# Patient Record
Sex: Male | Born: 1985 | Race: Black or African American | Hispanic: No | Marital: Single | Smoking: Never smoker
Health system: Southern US, Community
[De-identification: ages and names within clinical notes are randomized; demographics above are authoritative.]

## PROBLEM LIST (undated history)

## (undated) ENCOUNTER — Emergency Department: Admission: EM

## (undated) ENCOUNTER — Emergency Department: Admission: EM | Payer: Self-pay

## (undated) DIAGNOSIS — J45909 Unspecified asthma, uncomplicated: Secondary | ICD-10-CM

---

## 2006-04-06 ENCOUNTER — Emergency Department: Payer: Self-pay | Admitting: Unknown Physician Specialty

## 2007-01-19 ENCOUNTER — Emergency Department: Payer: Self-pay | Admitting: Emergency Medicine

## 2007-01-21 ENCOUNTER — Ambulatory Visit: Payer: Self-pay | Admitting: Emergency Medicine

## 2007-06-24 ENCOUNTER — Emergency Department: Payer: Self-pay | Admitting: Emergency Medicine

## 2007-06-24 ENCOUNTER — Other Ambulatory Visit: Payer: Self-pay

## 2007-06-30 ENCOUNTER — Emergency Department: Payer: Self-pay | Admitting: Emergency Medicine

## 2007-06-30 ENCOUNTER — Other Ambulatory Visit: Payer: Self-pay

## 2007-10-25 ENCOUNTER — Emergency Department: Payer: Self-pay | Admitting: Emergency Medicine

## 2008-06-01 ENCOUNTER — Emergency Department: Payer: Self-pay | Admitting: Internal Medicine

## 2010-07-18 ENCOUNTER — Emergency Department: Payer: Self-pay | Admitting: Internal Medicine

## 2012-04-28 ENCOUNTER — Emergency Department: Payer: Self-pay | Admitting: Unknown Physician Specialty

## 2012-08-28 ENCOUNTER — Emergency Department: Payer: Self-pay | Admitting: Internal Medicine

## 2013-07-07 ENCOUNTER — Emergency Department: Payer: Self-pay | Admitting: Emergency Medicine

## 2014-01-05 ENCOUNTER — Emergency Department: Payer: Self-pay | Admitting: Emergency Medicine

## 2014-05-15 ENCOUNTER — Emergency Department: Payer: Self-pay | Admitting: Emergency Medicine

## 2015-03-29 ENCOUNTER — Encounter: Payer: Self-pay | Admitting: Emergency Medicine

## 2015-03-29 ENCOUNTER — Emergency Department
Admission: EM | Admit: 2015-03-29 | Discharge: 2015-03-29 | Disposition: A | Discharge status: Home / Self Care | Payer: No Typology Code available for payment source | Attending: Emergency Medicine | Admitting: Emergency Medicine

## 2015-03-29 DIAGNOSIS — S39012A Strain of muscle, fascia and tendon of lower back, initial encounter: Secondary | ICD-10-CM | POA: Insufficient documentation

## 2015-03-29 DIAGNOSIS — Y998 Other external cause status: Secondary | ICD-10-CM | POA: Insufficient documentation

## 2015-03-29 DIAGNOSIS — S8991XA Unspecified injury of right lower leg, initial encounter: Secondary | ICD-10-CM | POA: Diagnosis not present

## 2015-03-29 DIAGNOSIS — S0990XA Unspecified injury of head, initial encounter: Secondary | ICD-10-CM | POA: Insufficient documentation

## 2015-03-29 DIAGNOSIS — Y92410 Unspecified street and highway as the place of occurrence of the external cause: Secondary | ICD-10-CM | POA: Diagnosis not present

## 2015-03-29 DIAGNOSIS — S3992XA Unspecified injury of lower back, initial encounter: Secondary | ICD-10-CM | POA: Diagnosis present

## 2015-03-29 DIAGNOSIS — M25561 Pain in right knee: Secondary | ICD-10-CM

## 2015-03-29 DIAGNOSIS — Y9389 Activity, other specified: Secondary | ICD-10-CM | POA: Insufficient documentation

## 2015-03-29 HISTORY — DX: Unspecified asthma, uncomplicated: J45.909

## 2015-03-29 MED ORDER — CYCLOBENZAPRINE HCL 10 MG PO TABS: 10.0000 mg | ORAL_TABLET | Freq: Three times a day (TID) | ORAL | Status: DC | PRN

## 2015-03-29 MED ORDER — IBUPROFEN 800 MG PO TABS: 800.0000 mg | ORAL_TABLET | Freq: Three times a day (TID) | ORAL | Status: DC | PRN

## 2015-03-29 MED ORDER — TRAMADOL HCL 50 MG PO TABS: 50.0000 mg | ORAL_TABLET | Freq: Four times a day (QID) | ORAL | Status: DC | PRN
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
## 2015-07-04 ENCOUNTER — Emergency Department: Payer: Self-pay

## 2015-07-04 ENCOUNTER — Emergency Department
Admission: EM | Admit: 2015-07-04 | Discharge: 2015-07-04 | Disposition: A | Discharge status: Home / Self Care | Payer: Self-pay | Attending: Emergency Medicine | Admitting: Emergency Medicine

## 2015-07-04 DIAGNOSIS — M25571 Pain in right ankle and joints of right foot: Secondary | ICD-10-CM | POA: Insufficient documentation

## 2015-07-04 DIAGNOSIS — J45909 Unspecified asthma, uncomplicated: Secondary | ICD-10-CM | POA: Insufficient documentation

## 2015-07-04 DIAGNOSIS — Z79899 Other long term (current) drug therapy: Secondary | ICD-10-CM | POA: Insufficient documentation

## 2015-07-04 MED ORDER — IBUPROFEN 600 MG PO TABS: 600.0000 mg | ORAL_TABLET | Freq: Four times a day (QID) | ORAL | Status: DC | PRN
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
## 2015-07-04 NOTE — ED Notes (Signed)
Pt states while standing on the escalator yesterday that stopped suddenly causing him to fall, pt states that he injured his rt foot and is having pain that radiates up the rt shin, no difficulty noted with walking or change in gait at this time

## 2015-07-04 NOTE — Discharge Instructions (Signed)

## 2016-01-31 ENCOUNTER — Emergency Department
Admission: EM | Admit: 2016-01-31 | Discharge: 2016-01-31 | Disposition: A | Discharge status: Home / Self Care | Payer: Self-pay | Attending: Emergency Medicine | Admitting: Emergency Medicine

## 2016-01-31 ENCOUNTER — Emergency Department: Payer: Self-pay

## 2016-01-31 ENCOUNTER — Encounter: Payer: Self-pay | Admitting: Emergency Medicine

## 2016-01-31 DIAGNOSIS — Z79899 Other long term (current) drug therapy: Secondary | ICD-10-CM | POA: Insufficient documentation

## 2016-01-31 DIAGNOSIS — I300 Acute nonspecific idiopathic pericarditis: Secondary | ICD-10-CM | POA: Insufficient documentation

## 2016-01-31 DIAGNOSIS — J45909 Unspecified asthma, uncomplicated: Secondary | ICD-10-CM | POA: Insufficient documentation

## 2016-01-31 LAB — CBC
HCT: 47.7 % (ref 40.0–52.0)
Hemoglobin: 15.8 g/dL (ref 13.0–18.0)
MCH: 29 pg (ref 26.0–34.0)
MCHC: 33.1 g/dL (ref 32.0–36.0)
MCV: 87.7 fL (ref 80.0–100.0)
PLATELETS: 226 10*3/uL (ref 150–440)
RBC: 5.44 MIL/uL (ref 4.40–5.90)
RDW: 12.9 % (ref 11.5–14.5)
WBC: 7.2 10*3/uL (ref 3.8–10.6)

## 2016-01-31 LAB — BASIC METABOLIC PANEL
Anion gap: 4 — ABNORMAL LOW (ref 5–15)
BUN: 10 mg/dL (ref 6–20)
CALCIUM: 9.9 mg/dL (ref 8.9–10.3)
CHLORIDE: 102 mmol/L (ref 101–111)
CO2: 31 mmol/L (ref 22–32)
CREATININE: 0.91 mg/dL (ref 0.61–1.24)
GFR calc non Af Amer: >60 mL/min (ref >60)
GLUCOSE: 104 mg/dL — AB (ref 65–99)
Potassium: 4.3 mmol/L (ref 3.5–5.1)
Sodium: 137 mmol/L (ref 135–145)

## 2016-01-31 LAB — TROPONIN I

## 2016-01-31 MED ORDER — COLCHICINE 0.6 MG PO TABS: 0.6000 mg | ORAL_TABLET | Freq: Two times a day (BID) | ORAL | 1 refills | Status: DC

## 2016-01-31 MED ORDER — IBUPROFEN 800 MG PO TABS: 800.0000 mg | ORAL_TABLET | Freq: Three times a day (TID) | ORAL | 0 refills | Status: DC

## 2016-01-31 NOTE — ED Triage Notes (Signed)
C/O chest pain x 1 day and headache since Friday.  States symptoms worsened today at work.

## 2016-01-31 NOTE — ED Provider Notes (Signed)
[facility] Emergency Department Provider Note   ____________________________________________    I have reviewed the triage vital signs and the nursing notes.   HISTORY  Chief Complaint Chest Pain     HPI Dennis Hayes is a 30 y.o. male who presents with chest pain which she reports is substernal and constant over the last 24 hours. He has never had chest pain before. He denies shortness of breath. He denies medical history besides asthma. No fevers or chills. He denies recent upper respiratory infection. No dizziness.   Past Medical History:  Diagnosis Date  . Asthma     There are no active problems to display for this patient.   History reviewed. No pertinent surgical history.  Prior to Admission medications   Medication Sig Start Date End Date Taking? Authorizing Provider  colchicine 0.6 MG tablet Take 1 tablet (0.6 mg total) by mouth 2 (two) times daily. 01/31/16 01/30/17  Jene Everyobert Vittoria Noreen, MD  cyclobenzaprine (FLEXERIL) 10 MG tablet Take 1 tablet (10 mg total) by mouth every 8 (eight) hours as needed for muscle spasms. 03/29/15   Joni Reiningonald K Smith, PA-C  ibuprofen (ADVIL,MOTRIN) 800 MG tablet Take 1 tablet (800 mg total) by mouth every 8 (eight) hours. 01/31/16   Jene Everyobert Ercell Razon, MD  traMADol (ULTRAM) 50 MG tablet Take 1 tablet (50 mg total) by mouth every 6 (six) hours as needed for moderate pain. 03/29/15   Joni Reiningonald K Smith, PA-C     Allergies Review of patient's allergies indicates no known allergies.  No family history on file.  Social History Social History  Substance Use Topics  . Smoking status: Never Smoker  . Smokeless tobacco: Never Used  . Alcohol use No    Review of Systems  Constitutional: No fever/chills Eyes: No visual changes.  ENT: No sore throat. Cardiovascular: As above Respiratory: Denies shortness of breath. Gastrointestinal: No abdominal pain.  No nausea, no vomiting.   Genitourinary: Negative for  dysuria. Musculoskeletal: Negative for back pain. Skin: Negative for rash. Neurological: Negative for headaches or weakness  10-point ROS otherwise negative.  ____________________________________________   PHYSICAL EXAM:  VITAL SIGNS: ED Triage Vitals  Enc Vitals Group     BP 01/31/16 1056 118/70     Pulse Rate 01/31/16 1056 63     Resp 01/31/16 1056 18     Temp 01/31/16 1056 98.1 F (36.7 C)     Temp Source 01/31/16 1056 Oral     SpO2 01/31/16 1056 98 %     Weight 01/31/16 1055 186 lb (84.4 kg)     Height 01/31/16 1055 5\' 9"  (1.753 m)     Head Circumference --      Peak Flow --      Pain Score 01/31/16 1055 7     Pain Loc --      Pain Edu? --      Excl. in GC? --     Constitutional: Alert and oriented. No acute distress.  Eyes: Conjunctivae are normal.  Head: Atraumatic. Nose: No congestion/rhinnorhea. Mouth/Throat: Mucous membranes are moist.    Cardiovascular: Normal rate, regular rhythm. No rub or murmur.  Good peripheral circulation. Respiratory: Normal respiratory effort.  No retractions.  Gastrointestinal: Soft and nontender. No distention.  No CVA tenderness. Genitourinary: deferred Musculoskeletal: No lower extremity tenderness nor edema.  Warm and well perfused Neurologic:  Normal speech and language. No gross focal neurologic deficits are appreciated.  Skin:  Skin is warm, dry and intact. No rash noted.  Psychiatric: Mood and affect are normal. Speech and behavior are normal.  ____________________________________________   LABS (all labs ordered are listed, but only abnormal results are displayed)  Labs Reviewed  BASIC METABOLIC PANEL - Abnormal; Notable for the following:       Result Value   Glucose, Bld 104 (*)    Anion gap 4 (*)    All other components within normal limits  CBC  TROPONIN I   ____________________________________________  EKG  ED ECG REPORT I, Jene EveryKINNER, Lorine Iannaccone, the attending physician, personally viewed and interpreted this  ECG.   Date: 01/31/2016    Rate: 61  Rhythm: normal EKG, normal sinus rhythm  Axis: Normal  Intervals:none  ST&T Change: Diffuse mild ST elevation  ____________________________________________  RADIOLOGY  Chest x-ray unremarkable ____________________________________________   PROCEDURES  Procedure(s) performed: No    Critical Care performed:No ____________________________________________   INITIAL IMPRESSION / ASSESSMENT AND PLAN / ED COURSE  Pertinent labs & imaging results that were available during my care of the patient were reviewed by me and considered in my medical decision making (see chart for details).  Patient presents with mild constant chest discomfort. He reports it is improved today he continues to have it. He denies short of breath. No medical history, no recent URIs. EKG concerning for pericarditis. Troponin and labs are unremarkable otherwise. Chest x-ray normal.  Clinical Course  Given the patient is currently well-appearing, labs unremarkable, vitals normal we will treat with NSAIDs and colchicine, cardiology follow-up. Return to the ED if any worsening. ____________________________________________   FINAL CLINICAL IMPRESSION(S) / ED DIAGNOSES  Final diagnoses:  Acute idiopathic pericarditis      NEW MEDICATIONS STARTED DURING THIS VISIT:  Discharge Medication List as of 01/31/2016  2:10 PM    START taking these medications   Details  colchicine 0.6 MG tablet Take 1 tablet (0.6 mg total) by mouth 2 (two) times daily., Starting Mon 01/31/2016, Until Tue 01/30/2017, Print         Note:  This document was prepared using Dragon voice recognition software and may include unintentional dictation errors.    Jene Everyobert Keeyon Privitera, MD 01/31/16 [phone]

## 2016-02-03 ENCOUNTER — Ambulatory Visit (INDEPENDENT_AMBULATORY_CARE_PROVIDER_SITE_OTHER): Payer: Self-pay | Admitting: Cardiology

## 2016-02-03 ENCOUNTER — Encounter: Payer: Self-pay | Admitting: Cardiology

## 2016-02-03 VITALS — BP 90/60 | HR 67 | Ht 70.0 in | Wt 194.8 lb

## 2016-02-03 DIAGNOSIS — R011 Cardiac murmur, unspecified: Secondary | ICD-10-CM

## 2016-02-03 DIAGNOSIS — R9431 Abnormal electrocardiogram [ECG] [EKG]: Secondary | ICD-10-CM

## 2016-02-03 DIAGNOSIS — I308 Other forms of acute pericarditis: Secondary | ICD-10-CM

## 2016-02-03 MED ORDER — OMEPRAZOLE 20 MG PO CPDR: 20.0000 mg | DELAYED_RELEASE_CAPSULE | Freq: Every day | ORAL | 0 refills | Status: DC

## 2016-02-03 NOTE — Progress Notes (Signed)
Cardiology Office Note   Date:  02/03/2016   ID:  Dennis BlalockJohn D Wernette, DOB 01/29/1986, MRN [id number]  Referring Doctor:  No PCP Per Patient   Cardiologist:   Almond LintAileen Teria Khachatryan, MD   Reason for consultation:  Chief Complaint  Patient presents with  . other    F/u ED due to chest pain c/o chest pain and left arm pain today. Meds reviewed verbally with pt.      History of Present Illness: Dennis Hayes is a 30 y.o. male who presents for Chest pain.  Patient presented to the ER on Monday, 01/31/2016 for chest pain. Apparently, it has been going on for quite some time now but he has not really paid much attention to it. On Monday, it was more persistent, sharp pain across the chest, not related to exertion or positional change, nonradiating, mild to moderate in intensity, lasting on and off throughout the day. He was found to have pericarditis on EKG. He was started on ibuprofen on colchicine and asked to follow-up with cardiology.  He has had not noticed a significant change in his symptoms. He continues to take colchicine and ibuprofen. No bleeding. No shortness of breath, loss of consciousness, palpitations.   ROS:  Please see the history of present illness. Aside from mentioned under HPI, all other systems are reviewed and negative.     Past Medical History:  Diagnosis Date  . Asthma     History reviewed. No pertinent surgical history.   reports that he has never smoked. He has never used smokeless tobacco. He reports that he does not drink alcohol or use drugs.   family history includes Heart attack in his maternal grandmother; Hypertension in his maternal grandfather.   Outpatient Medications Prior to Visit  Medication Sig Dispense Refill  . colchicine 0.6 MG tablet Take 1 tablet (0.6 mg total) by mouth 2 (two) times daily. 120 tablet 1  . cyclobenzaprine (FLEXERIL) 10 MG tablet Take 1 tablet (10 mg total) by mouth every 8 (eight) hours as needed for muscle spasms. 15 tablet 0    . ibuprofen (ADVIL,MOTRIN) 800 MG tablet Take 1 tablet (800 mg total) by mouth every 8 (eight) hours. 45 tablet 0  . traMADol (ULTRAM) 50 MG tablet Take 1 tablet (50 mg total) by mouth every 6 (six) hours as needed for moderate pain. (Patient not taking: Reported on 02/03/2016) 12 tablet 0   No facility-administered medications prior to visit.      Allergies: Review of patient's allergies indicates no known allergies.    PHYSICAL EXAM: VS:  BP 90/60 (BP Location: Left Arm, Patient Position: Sitting, Cuff Size: Normal)   Pulse 67   Ht 5\' 10"  (1.778 m)   Wt 194 lb 12 oz (88.3 kg)   BMI 27.94 kg/m  , Body mass index is 27.94 kg/m. Wt Readings from Last 3 Encounters:  02/03/16 194 lb 12 oz (88.3 kg)  01/31/16 186 lb (84.4 kg)  07/04/15 203 lb (92.1 kg)    GENERAL:  well developed, well nourished, not in acute distress HEENT: normocephalic, pink conjunctivae, anicteric sclerae, no xanthelasma, normal dentition, oropharynx clear NECK:  no neck vein engorgement, JVP normal, no hepatojugular reflux, carotid upstroke brisk and symmetric, no bruit, no thyromegaly, no lymphadenopathy LUNGS:  good respiratory effort, clear to auscultation bilaterally CV:  PMI not displaced, no thrills, no lifts, S1 and S2 within normal limits, no palpable S3 or S4, Soft systolic murmur, no rubs, no gallops ABD:  Soft,  nontender, nondistended, normoactive bowel sounds, no abdominal aortic bruit, no hepatomegaly, no splenomegaly MS: nontender back, no kyphosis, no scoliosis, no joint deformities EXT:  2+ DP/PT pulses, no edema, no varicosities, no cyanosis, no clubbing SKIN: warm, nondiaphoretic, normal turgor, no ulcers NEUROPSYCH: alert, oriented to person, place, and time, sensory/motor grossly intact, normal mood, appropriate affect  Recent Labs: 01/31/2016: BUN 10; Creatinine, Ser 0.91; Hemoglobin 15.8; Platelets 226; Potassium 4.3; Sodium 137   Lipid Panel No results found for: CHOL, TRIG, HDL, CHOLHDL,  VLDL, LDLCALC, LDLDIRECT   Other studies Reviewed:  EKG:  The ekg from 02/03/2016 was personally reviewed by me and it revealed sinus rhythm, 67 BPM. Nonspecific ST-T wave changes. Compared to EKG from 01/31/2016, there is mild improvement and PR depression.  Additional studies/ records that were reviewed personally reviewed by me today include: None available   ASSESSMENT AND PLAN:  Acute pericarditis Abnormal EKG showing changes of pericarditis, today's is improved from last Monday EKG. Agree with ibuprofen and colchicine for now. Recommend GI prophylaxis with Prilosec 20 mg by mouth daily Recommend check BMP in 2 weeks. Recommend echocardiogram. Recommend follow-up in 4 weeks' time or sooner if symptoms are not improving at all.  Systolic murmur. Recommend echocardiogram  Current medicines are reviewed at length with the patient today.  The patient does not have concerns regarding medicines.  Labs/ tests ordered today include:  Orders Placed This Encounter  Procedures  . Basic metabolic panel  . ECHOCARDIOGRAM COMPLETE    I had a lengthy and detailed discussion with the patient regarding diagnoses, prognosis, diagnostic options, treatment options , and side effects of medications.   I counseled the patient on importance of lifestyle modification including heart healthy diet, regular physical activity .   Disposition:   FU with undersigned after tests    Signed, Almond LintAileen Slade [place], MD  02/03/2016 4:44 PM    [facility]  This note was generated in part with voice recognition software and I apologize for any typographical errors that were not detected and corrected.

## 2016-02-03 NOTE — Patient Instructions (Addendum)
Medication Instructions:  Your physician has recommended you make the following change in your medication:  1. START Prilosec 20 mg Once daily   Labwork: Your physician recommends that you return for lab work in: 2 weeks for BMP   Testing/Procedures: Your physician has requested that you have an echocardiogram. Echocardiography is a painless test that uses sound waves to create images of your heart. It provides your doctor with information about the size and shape of your heart and how well your heart's chambers and valves are working. This procedure takes approximately one hour. There are no restrictions for this procedure.    Follow-Up: Your physician recommends that you schedule a follow-up appointment in: 1 month with Dr. Alvino ChapelIngal.  It was a pleasure seeing you today here in the office. Please do not hesitate to give us a call back if you have any further questions. [phone]  Irving CellarPamela A. RN, BSN    Echocardiogram An echocardiogram, or echocardiography, uses sound waves (ultrasound) to produce an image of your heart. The echocardiogram is simple, painless, obtained within a short period of time, and offers valuable information to your health care provider. The images from an echocardiogram can provide information such as:  Evidence of coronary artery disease (CAD).  Heart size.  Heart muscle function.  Heart valve function.  Aneurysm detection.  Evidence of a past heart attack.  Fluid buildup around the heart.  Heart muscle thickening.  Assess heart valve function. LET [facility] PROVIDER KNOW ABOUT:  Any allergies you have.  All medicines you are taking, including vitamins, herbs, eye drops, creams, and over-the-counter medicines.  Previous problems you or members of your family have had with the use of anesthetics.  Any blood disorders you have.  Previous surgeries you have had.  Medical conditions you have.  Possibility of pregnancy, if this  applies. BEFORE THE PROCEDURE  No special preparation is needed. Eat and drink normally.  PROCEDURE   In order to produce an image of your heart, gel will be applied to your chest and a wand-like tool (transducer) will be moved over your chest. The gel will help transmit the sound waves from the transducer. The sound waves will harmlessly bounce off your heart to allow the heart images to be captured in real-time motion. These images will then be recorded.  You may need an IV to receive a medicine that improves the quality of the pictures. AFTER THE PROCEDURE You may return to your normal schedule including diet, activities, and medicines, unless your health care provider tells you otherwise.   This information is not intended to replace advice given to you by your health care provider. Make sure you discuss any questions you have with your health care provider.   Document Released: 03/17/2000 Document Revised: 04/10/2014 Document Reviewed: 11/25/2012 Elsevier Interactive Patient Education [employer]2016 [employer] Inc.

## 2016-02-07 NOTE — Addendum Note (Signed)
Addended by: Festus AloeRESPO, Jafeth Mustin G on: 02/07/2016 08:43 AM   Modules accepted: Orders

## 2016-02-18 ENCOUNTER — Other Ambulatory Visit (INDEPENDENT_AMBULATORY_CARE_PROVIDER_SITE_OTHER): Payer: Self-pay | Admitting: *Deleted

## 2016-02-18 DIAGNOSIS — I308 Other forms of acute pericarditis: Secondary | ICD-10-CM

## 2016-02-19 LAB — BASIC METABOLIC PANEL
BUN/Creatinine Ratio: 9 (ref 9–20)
BUN: 9 mg/dL (ref 6–20)
CO2: 26 mmol/L (ref 18–29)
Calcium: 9.8 mg/dL (ref 8.7–10.2)
Chloride: 103 mmol/L (ref 96–106)
Creatinine, Ser: 0.99 mg/dL (ref 0.76–1.27)
GFR calc Af Amer: 118 mL/min/{1.73_m2} (ref >59)
GFR calc non Af Amer: 102 mL/min/{1.73_m2} (ref >59)
GLUCOSE: 77 mg/dL (ref 65–99)
POTASSIUM: 4.2 mmol/L (ref 3.5–5.2)
SODIUM: 144 mmol/L (ref 134–144)

## 2016-03-01 ENCOUNTER — Other Ambulatory Visit: Payer: Self-pay

## 2016-03-01 ENCOUNTER — Ambulatory Visit (INDEPENDENT_AMBULATORY_CARE_PROVIDER_SITE_OTHER): Payer: Self-pay

## 2016-03-01 DIAGNOSIS — I308 Other forms of acute pericarditis: Secondary | ICD-10-CM

## 2016-03-07 ENCOUNTER — Ambulatory Visit (INDEPENDENT_AMBULATORY_CARE_PROVIDER_SITE_OTHER): Payer: Self-pay | Admitting: Cardiology

## 2016-03-07 ENCOUNTER — Encounter: Payer: Self-pay | Admitting: Cardiology

## 2016-03-07 VITALS — BP 108/84 | HR 68 | Ht 70.0 in | Wt 197.2 lb

## 2016-03-07 DIAGNOSIS — R079 Chest pain, unspecified: Secondary | ICD-10-CM

## 2016-03-07 DIAGNOSIS — I300 Acute nonspecific idiopathic pericarditis: Secondary | ICD-10-CM

## 2016-03-07 MED ORDER — IBUPROFEN 800 MG PO TABS: 800.0000 mg | ORAL_TABLET | Freq: Three times a day (TID) | ORAL | 0 refills | Status: DC

## 2016-03-07 MED ORDER — COLCHICINE 0.6 MG PO TABS: 0.6000 mg | ORAL_TABLET | Freq: Three times a day (TID) | ORAL | 3 refills | Status: DC

## 2016-03-07 NOTE — Patient Instructions (Signed)
Medication Instructions:  Your physician has recommended you make the following change in your medication:  1. Refill sent in for 1 month of Ibuprofen 800 mg every 8 hours.  2. Colchicine 0.6 mg three times a day    Labwork: Your physician recommends that you return for lab work in: 2 weeks for BMP.   Follow-Up: Your physician recommends that you schedule a follow-up appointment in: 1 month with Dr. Alvino ChapelIngal.  It was a pleasure seeing you today here in the office. Please do not hesitate to give us a call back if you have any further questions. [phone]  Bailey CellarPamela A. RN, BSN

## 2016-03-07 NOTE — Progress Notes (Signed)
Cardiology Office Note   Date:  03/07/2016   ID:  Dennis BlalockJohn D Hannum, DOB 08/30/1985, MRN [id number]  Referring Doctor:  No PCP Per Patient   Cardiologist:   Almond LintAileen Graden Hoshino, MD   Reason for consultation:  Chief Complaint  Patient presents with  . other    3120m fu echo. Pt c/o chest pain 3-4 times every other day. Reviewed meds with pt verbally.      History of Present Illness: Dennis Hayes is a 30 y.o. male who presents for Chest pain.  Review of records show: Patient presented to the ER on Monday, 01/31/2016 for chest pain. Apparently, it has been going on for quite some time now but he has not really paid much attention to it. On Monday, it was more persistent, sharp pain across the chest, not related to exertion or positional change, nonradiating, mild to moderate in intensity, lasting on and off throughout the day. He was found to have pericarditis on EKG. He was started on ibuprofen on colchicine and asked to follow-up with cardiology.  Since his last visit, he continues to have chest pain. He feels that they are less intense compared to before. Less frequent as well. However he continues to have episodes, they're not completely resolved. No problems with bleeding. No abdominal pain. No shortness of breath.   ROS:  Please see the history of present illness. Aside from mentioned under HPI, all other systems are reviewed and negative.     Past Medical History:  Diagnosis Date  . Asthma     History reviewed. No pertinent surgical history.   reports that he has never smoked. He has never used smokeless tobacco. He reports that he does not drink alcohol or use drugs.   family history includes Heart attack in his maternal grandmother; Hypertension in his maternal grandfather.   Outpatient Medications Prior to Visit  Medication Sig Dispense Refill  . cyclobenzaprine (FLEXERIL) 10 MG tablet Take 1 tablet (10 mg total) by mouth every 8 (eight) hours as needed for muscle spasms. 15  tablet 0  . omeprazole (PRILOSEC) 20 MG capsule Take 1 capsule (20 mg total) by mouth daily. 30 capsule 0  . colchicine 0.6 MG tablet Take 1 tablet (0.6 mg total) by mouth 2 (two) times daily. 120 tablet 1  . ibuprofen (ADVIL,MOTRIN) 800 MG tablet Take 1 tablet (800 mg total) by mouth every 8 (eight) hours. 45 tablet 0   No facility-administered medications prior to visit.      Allergies: Patient has no known allergies.    PHYSICAL EXAM: VS:  BP 108/84 (BP Location: Left Arm, Patient Position: Sitting, Cuff Size: Normal)   Pulse 68   Ht 5\' 10"  (1.778 m)   Wt 197 lb 4 oz (89.5 kg)   BMI 28.30 kg/m  , Body mass index is 28.3 kg/m. Wt Readings from Last 3 Encounters:  03/07/16 197 lb 4 oz (89.5 kg)  02/03/16 194 lb 12 oz (88.3 kg)  01/31/16 186 lb (84.4 kg)    GENERAL:  well developed, well nourished, not in acute distress HEENT: normocephalic, pink conjunctivae, anicteric sclerae, no xanthelasma, normal dentition, oropharynx clear NECK:  no neck vein engorgement, JVP normal, no hepatojugular reflux, carotid upstroke brisk and symmetric, no bruit, no thyromegaly, no lymphadenopathy LUNGS:  good respiratory effort, clear to auscultation bilaterally CV:  PMI not displaced, no thrills, no lifts, S1 and S2 within normal limits, no palpable S3 or S4, Soft systolic murmur, no rubs, no gallops  ABD:  Soft, nontender, nondistended, normoactive bowel sounds, no abdominal aortic bruit, no hepatomegaly, no splenomegaly MS: nontender back, no kyphosis, no scoliosis, no joint deformities EXT:  2+ DP/PT pulses, no edema, no varicosities, no cyanosis, no clubbing SKIN: warm, nondiaphoretic, normal turgor, no ulcers NEUROPSYCH: alert, oriented to person, place, and time, sensory/motor grossly intact, normal mood, appropriate affect  Recent Labs: 01/31/2016: Hemoglobin 15.8; Platelets 226 02/18/2016: BUN 9; Creatinine, Ser 0.99; Potassium 4.2; Sodium 144   Lipid Panel No results found for: CHOL,  TRIG, HDL, CHOLHDL, VLDL, LDLCALC, LDLDIRECT   Other studies Reviewed:  EKG:  The ekg from 02/03/2016 was personally reviewed by me and it revealed sinus rhythm, 67 BPM. Nonspecific ST-T wave changes. Compared to EKG from 01/31/2016, there is mild improvement and PR depression.  Additional studies/ records that were reviewed personally reviewed by me today include:  Echo 03/01/2016: Left ventricle: The cavity size was normal. Wall thickness was   normal. Systolic function was normal. The estimated ejection   fraction was in the range of 55% to 60%. Wall motion was normal;   there were no regional wall motion abnormalities. Left   ventricular diastolic function parameters were normal.   ASSESSMENT AND PLAN:  Acute pericarditis Abnormal EKG showing changes of pericarditis Continue with ibuprofen 800 mg every 8 hours Trial with higher dose of colchicine 0.6 mg 3 times a day.  Recommend GI prophylaxis with Prilosec 20 mg by mouth daily Check BMP today and in 2 weeks Recommend adequate hydration, take NSAIDs with meals No pericardial effusion on echocardiogram. Recommend follow-up in 4 weeks' time or sooner if symptoms are not improving at all.  Systolic murmur. No significant valve problems noted  Current medicines are reviewed at length with the patient today.  The patient does not have concerns regarding medicines.  Labs/ tests ordered today include:  Orders Placed This Encounter  Procedures  . Basic metabolic panel  . Basic metabolic panel    I had a lengthy and detailed discussion with the patient regarding diagnoses, prognosis, diagnostic options, treatment options , and side effects of medications.   I counseled the patient on importance of lifestyle modification including heart healthy diet, regular physical activity .   Disposition:   FU with undersigned In one month  Signed, Almond LintAileen Huy Majid, MD  03/07/2016 11:24 AM    [facility]  This  note was generated in part with voice recognition software and I apologize for any typographical errors that were not detected and corrected.

## 2016-03-08 LAB — BASIC METABOLIC PANEL
BUN / CREAT RATIO: 20 (ref 9–20)
BUN: 16 mg/dL (ref 6–20)
CHLORIDE: 100 mmol/L (ref 96–106)
CO2: 26 mmol/L (ref 18–29)
Calcium: 9.7 mg/dL (ref 8.7–10.2)
Creatinine, Ser: 0.8 mg/dL (ref 0.76–1.27)
GFR calc Af Amer: 140 mL/min/{1.73_m2} (ref >59)
GFR calc non Af Amer: 121 mL/min/{1.73_m2} (ref >59)
GLUCOSE: 98 mg/dL (ref 65–99)
Potassium: 4.2 mmol/L (ref 3.5–5.2)
Sodium: 140 mmol/L (ref 134–144)

## 2016-03-10 ENCOUNTER — Encounter: Payer: Self-pay | Admitting: *Deleted

## 2016-03-10 ENCOUNTER — Ambulatory Visit: Payer: Self-pay | Admitting: Pharmacy Technician

## 2016-03-10 ENCOUNTER — Emergency Department
Admission: EM | Admit: 2016-03-10 | Discharge: 2016-03-11 | Disposition: A | Discharge status: Home / Self Care | Payer: No Typology Code available for payment source | Attending: Emergency Medicine | Admitting: Emergency Medicine

## 2016-03-10 DIAGNOSIS — J45909 Unspecified asthma, uncomplicated: Secondary | ICD-10-CM | POA: Diagnosis not present

## 2016-03-10 DIAGNOSIS — Y999 Unspecified external cause status: Secondary | ICD-10-CM | POA: Insufficient documentation

## 2016-03-10 DIAGNOSIS — S3992XA Unspecified injury of lower back, initial encounter: Secondary | ICD-10-CM | POA: Diagnosis present

## 2016-03-10 DIAGNOSIS — Y9389 Activity, other specified: Secondary | ICD-10-CM | POA: Diagnosis not present

## 2016-03-10 DIAGNOSIS — Z79899 Other long term (current) drug therapy: Secondary | ICD-10-CM

## 2016-03-10 DIAGNOSIS — Y92410 Unspecified street and highway as the place of occurrence of the external cause: Secondary | ICD-10-CM | POA: Insufficient documentation

## 2016-03-10 DIAGNOSIS — S39012A Strain of muscle, fascia and tendon of lower back, initial encounter: Secondary | ICD-10-CM | POA: Diagnosis not present

## 2016-03-10 NOTE — Progress Notes (Signed)
  Completed [facility] application and contract.  Patient agreed to all terms of the [facility] contract.  Patient to provide 30 days of pay stubs, utility bill and 2016 taxes.  Provided patient with Community education officercommunity resource material based on his particular needs.     Sherilyn DacostaBetty J. Quindell Shere Care Manager [facility]

## 2016-03-10 NOTE — Addendum Note (Signed)
Addended by: Festus AloeRESPO, Jayleah Garbers G on: 03/10/2016 08:39 AM   Modules accepted: Orders
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
## 2016-03-11 MED ORDER — NAPROXEN 500 MG PO TABS: 500.0000 mg | ORAL_TABLET | Freq: Two times a day (BID) | ORAL | 0 refills | Status: DC

## 2016-03-11 MED ORDER — NAPROXEN 500 MG PO TABS
500.0000 mg | ORAL_TABLET | Freq: Once | ORAL | Status: AC
  Administered 2016-03-11: 500 mg via ORAL
  Filled 2016-03-11: qty 1
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
## 2016-03-21 ENCOUNTER — Other Ambulatory Visit (INDEPENDENT_AMBULATORY_CARE_PROVIDER_SITE_OTHER): Payer: Self-pay | Admitting: *Deleted

## 2016-03-21 DIAGNOSIS — I300 Acute nonspecific idiopathic pericarditis: Secondary | ICD-10-CM

## 2016-03-22 LAB — BASIC METABOLIC PANEL
BUN / CREAT RATIO: 12 (ref 9–20)
BUN: 11 mg/dL (ref 6–20)
CHLORIDE: 100 mmol/L (ref 96–106)
CO2: 24 mmol/L (ref 18–29)
CREATININE: 0.91 mg/dL (ref 0.76–1.27)
Calcium: 9.6 mg/dL (ref 8.7–10.2)
GFR calc Af Amer: 131 mL/min/{1.73_m2} (ref >59)
GFR calc non Af Amer: 113 mL/min/{1.73_m2} (ref >59)
GLUCOSE: 103 mg/dL — AB (ref 65–99)
Potassium: 4.2 mmol/L (ref 3.5–5.2)
SODIUM: 141 mmol/L (ref 134–144)

## 2016-04-04 ENCOUNTER — Encounter: Payer: Self-pay | Admitting: *Deleted

## 2016-04-04 ENCOUNTER — Emergency Department
Admission: EM | Admit: 2016-04-04 | Discharge: 2016-04-04 | Disposition: A | Discharge status: Home / Self Care | Payer: Self-pay | Attending: Emergency Medicine | Admitting: Emergency Medicine

## 2016-04-04 DIAGNOSIS — R197 Diarrhea, unspecified: Secondary | ICD-10-CM

## 2016-04-04 DIAGNOSIS — J45909 Unspecified asthma, uncomplicated: Secondary | ICD-10-CM | POA: Insufficient documentation

## 2016-04-04 DIAGNOSIS — R112 Nausea with vomiting, unspecified: Secondary | ICD-10-CM

## 2016-04-04 DIAGNOSIS — Z79899 Other long term (current) drug therapy: Secondary | ICD-10-CM | POA: Insufficient documentation

## 2016-04-04 DIAGNOSIS — K529 Noninfective gastroenteritis and colitis, unspecified: Secondary | ICD-10-CM | POA: Insufficient documentation

## 2016-04-04 LAB — COMPREHENSIVE METABOLIC PANEL
ALBUMIN: 4.7 g/dL (ref 3.5–5.0)
ALK PHOS: 63 U/L (ref 38–126)
ALT: 21 U/L (ref 17–63)
ANION GAP: 5 (ref 5–15)
AST: 26 U/L (ref 15–41)
BILIRUBIN TOTAL: 1 mg/dL (ref 0.3–1.2)
BUN: 10 mg/dL (ref 6–20)
CO2: 32 mmol/L (ref 22–32)
Calcium: 9.9 mg/dL (ref 8.9–10.3)
Chloride: 103 mmol/L (ref 101–111)
Creatinine, Ser: 0.98 mg/dL (ref 0.61–1.24)
GFR calc Af Amer: >60 mL/min (ref >60)
GFR calc non Af Amer: >60 mL/min (ref >60)
GLUCOSE: 117 mg/dL — AB (ref 65–99)
POTASSIUM: 4 mmol/L (ref 3.5–5.1)
SODIUM: 140 mmol/L (ref 135–145)
TOTAL PROTEIN: 8.8 g/dL — AB (ref 6.5–8.1)

## 2016-04-04 LAB — URINALYSIS, COMPLETE (UACMP) WITH MICROSCOPIC
BACTERIA UA: NONE SEEN
Bilirubin Urine: NEGATIVE
Glucose, UA: NEGATIVE mg/dL
Hgb urine dipstick: NEGATIVE
Ketones, ur: NEGATIVE mg/dL
Leukocytes, UA: NEGATIVE
Nitrite: NEGATIVE
PROTEIN: NEGATIVE mg/dL
RBC / HPF: NONE SEEN RBC/hpf (ref 0–5)
SPECIFIC GRAVITY, URINE: 1.019 (ref 1.005–1.030)
pH: 7 (ref 5.0–8.0)

## 2016-04-04 LAB — CBC
HEMATOCRIT: 45.5 % (ref 40.0–52.0)
HEMOGLOBIN: 15.8 g/dL (ref 13.0–18.0)
MCH: 29.8 pg (ref 26.0–34.0)
MCHC: 34.7 g/dL (ref 32.0–36.0)
MCV: 85.8 fL (ref 80.0–100.0)
Platelets: 223 10*3/uL (ref 150–440)
RBC: 5.31 MIL/uL (ref 4.40–5.90)
RDW: 13.4 % (ref 11.5–14.5)
WBC: 8.3 10*3/uL (ref 3.8–10.6)

## 2016-04-04 LAB — LIPASE, BLOOD: Lipase: 29 U/L (ref 11–51)

## 2016-04-04 MED ORDER — METOCLOPRAMIDE HCL 5 MG/ML IJ SOLN
10.0000 mg | Freq: Once | INTRAMUSCULAR | Status: AC
  Administered 2016-04-04: 10 mg via INTRAVENOUS
  Filled 2016-04-04: qty 2

## 2016-04-04 MED ORDER — ONDANSETRON HCL 4 MG PO TABS: 4.0000 mg | ORAL_TABLET | Freq: Three times a day (TID) | ORAL | 0 refills | Status: DC | PRN

## 2016-04-04 MED ORDER — KETOROLAC TROMETHAMINE 30 MG/ML IJ SOLN
30.0000 mg | Freq: Once | INTRAMUSCULAR | Status: AC
  Administered 2016-04-04: 30 mg via INTRAVENOUS
  Filled 2016-04-04: qty 1

## 2016-04-04 MED ORDER — SODIUM CHLORIDE 0.9 % IV BOLUS (SEPSIS)
1000.0000 mL | Freq: Once | INTRAVENOUS | Status: AC
  Administered 2016-04-04: 1000 mL via INTRAVENOUS

## 2016-04-04 NOTE — ED Triage Notes (Signed)
States headache and vomiting for 4 days, states a few episodes of diarrhea, states he has not been eating, awake and alert in no acute distress

## 2016-04-04 NOTE — ED Provider Notes (Signed)
[facility] Emergency Department Provider Note  ____________________________________________  Time seen: Approximately 11:28 AM  I have reviewed the triage vital signs and the nursing notes.   HISTORY  Chief Complaint Headache and Emesis   HPI Dennis Hayes is a 31 y.o. male no significant past medical history who presents for evaluation of 4 days of body aches, nausea, vomiting, diarrhea. Patient denies fevers but has had chills. No cough, congestion, shortness of breath, chest pain, sore throat, neck stiffness or rash. Has not received his flu shot this season. He says that today his been feeling dizzy which prompted his visit to the emergency room. He has had multiple episodes of nonbloody nonbilious emesis for the last few days and this morning had some blood streaking in the emesis. Also has had nonbloody watery diarrhea multiple episodes for 4 days. Also endorses mild soreness diffusely in his abdomen which he thinks is from the vomiting, currently 4/10, constant, nonradiating.  Past Medical History:  Diagnosis Date  . Asthma     There are no active problems to display for this patient.   History reviewed. No pertinent surgical history.  Prior to Admission medications   Medication Sig Start Date End Date Taking? Authorizing Provider  colchicine 0.6 MG tablet Take 1 tablet (0.6 mg total) by mouth 3 (three) times daily. 03/07/16   Almond Lint, MD  cyclobenzaprine (FLEXERIL) 10 MG tablet Take 1 tablet (10 mg total) by mouth every 8 (eight) hours as needed for muscle spasms. 03/29/15   Joni Reining, PA-C  ibuprofen (ADVIL,MOTRIN) 800 MG tablet Take 1 tablet (800 mg total) by mouth every 8 (eight) hours. 03/07/16   Almond Lint, MD  naproxen (NAPROSYN) 500 MG tablet Take 1 tablet (500 mg total) by mouth 2 (two) times daily with a meal. 03/11/16   Sharman Cheek, MD  omeprazole (PRILOSEC) 20 MG capsule Take 1 capsule (20 mg total) by mouth daily. 02/03/16    Almond Lint, MD  ondansetron (ZOFRAN) 4 MG tablet Take 1 tablet (4 mg total) by mouth every 8 (eight) hours as needed for nausea or vomiting. 04/04/16   Nita Sickle, MD    Allergies Patient has no known allergies.  Family History  Problem Relation Age of Onset  . Heart attack Maternal Grandmother   . Hypertension Maternal Grandfather     Social History Social History  Substance Use Topics  . Smoking status: Never Smoker  . Smokeless tobacco: Never Used  . Alcohol use No    Review of Systems  Constitutional: Negative for fever. + chills and body aches and dizziness Eyes: Negative for visual changes. ENT: Negative for sore throat. Neck: No neck pain  Cardiovascular: Negative for chest pain. Respiratory: Negative for shortness of breath. Gastrointestinal: + abdominal pain, vomiting and diarrhea. Genitourinary: Negative for dysuria. Musculoskeletal: Negative for back pain. Skin: Negative for rash. Neurological: Negative for headaches, weakness or numbness. Psych: No SI or HI  ____________________________________________   PHYSICAL EXAM:  VITAL SIGNS: ED Triage Vitals [04/04/16 1015]  Enc Vitals Group     BP 121/81     Pulse Rate 67     Resp 18     Temp 97.7 F (36.5 C)     Temp Source Oral     SpO2 99 %     Weight 190 lb (86.2 kg)     Height 5\' 10"  (1.778 m)     Head Circumference      Peak Flow  Pain Score 8     Pain Loc      Pain Edu?      Excl. in GC?     Constitutional: Alert and oriented. Well appearing and in no apparent distress. HEENT:      Head: Normocephalic and atraumatic.         Eyes: Conjunctivae are normal. Sclera is non-icteric. EOMI. PERRL      Mouth/Throat: Mucous membranes are moist.       Neck: Supple with no signs of meningismus. Cardiovascular: Regular rate and rhythm. No murmurs, gallops, or rubs. 2+ symmetrical distal pulses are present in all extremities. No JVD. Respiratory: Normal respiratory effort. Lungs are clear  to auscultation bilaterally. No wheezes, crackles, or rhonchi.  Gastrointestinal: Soft, non tender, and non distended with positive bowel sounds. No rebound or guarding. Genitourinary: No CVA tenderness. Musculoskeletal: Nontender with normal range of motion in all extremities. No edema, cyanosis, or erythema of extremities. Neurologic: Normal speech and language. Face is symmetric. Moving all extremities. No gross focal neurologic deficits are appreciated. Skin: Skin is warm, dry and intact. No rash noted. Psychiatric: Mood and affect are normal. Speech and behavior are normal.  ____________________________________________   LABS (all labs ordered are listed, but only abnormal results are displayed)  Labs Reviewed  COMPREHENSIVE METABOLIC PANEL - Abnormal; Notable for the following:       Result Value   Glucose, Bld 117 (*)    Total Protein 8.8 (*)    All other components within normal limits  URINALYSIS, COMPLETE (UACMP) WITH MICROSCOPIC - Abnormal; Notable for the following:    Color, Urine YELLOW (*)    APPearance CLEAR (*)    Squamous Epithelial / LPF 0-5 (*)    All other components within normal limits  LIPASE, BLOOD  CBC   ____________________________________________  EKG  none ____________________________________________  RADIOLOGY  none  ____________________________________________   PROCEDURES  Procedure(s) performed: None Procedures Critical Care performed:  None ____________________________________________   INITIAL IMPRESSION / ASSESSMENT AND PLAN / ED COURSE  31 y.o. male no significant past medical history who presents for evaluation of 4 days of body aches, nausea, vomiting, diarrhea consistent with viral gastroenteritis. Patient's physical exam is with no acute findings. Labs are normal including CBC, CMP, lipase, and urinalysis with no acute tones, normal kidney function and electrolytes. We'll give IV fluids, IV Toradol, and IV Zofran and  reassess.  Clinical Course as of Apr 04 1320  Tue Apr 04, 2016  1320 Patient feels markedly improved. VS WNL. Tolerating PO. No vomiting or diarrhea in the ED. Will dc home on zofran, supportive care, and close f/u   [CV]    Clinical Course User Index [CV] Nita Sicklearolina Kemp Gomes, MD    Pertinent labs & imaging results that were available during my care of the patient were reviewed by me and considered in my medical decision making (see chart for details).    ____________________________________________   FINAL CLINICAL IMPRESSION(S) / ED DIAGNOSES  Final diagnoses:  Gastroenteritis  Nausea vomiting and diarrhea      NEW MEDICATIONS STARTED DURING THIS VISIT:  New Prescriptions   ONDANSETRON (ZOFRAN) 4 MG TABLET    Take 1 tablet (4 mg total) by mouth every 8 (eight) hours as needed for nausea or vomiting.     Note:  This document was prepared using Dragon voice recognition software and may include unintentional dictation errors.    Nita Sicklearolina Venita Seng, MD 04/04/16 1322

## 2016-04-06 ENCOUNTER — Other Ambulatory Visit: Payer: Self-pay | Admitting: Cardiology

## 2016-04-06 DIAGNOSIS — I308 Other forms of acute pericarditis: Secondary | ICD-10-CM

## 2016-04-11 ENCOUNTER — Encounter: Payer: Self-pay | Admitting: Cardiology

## 2016-04-11 ENCOUNTER — Other Ambulatory Visit
Admission: RE | Admit: 2016-04-11 | Discharge: 2016-04-11 | Disposition: A | Discharge status: Home / Self Care | Payer: Self-pay | Source: Ambulatory Visit | Attending: Cardiology | Admitting: Cardiology

## 2016-04-11 ENCOUNTER — Ambulatory Visit (INDEPENDENT_AMBULATORY_CARE_PROVIDER_SITE_OTHER): Payer: Self-pay | Admitting: Cardiology

## 2016-04-11 ENCOUNTER — Telehealth: Payer: Self-pay | Admitting: Cardiology

## 2016-04-11 VITALS — BP 100/78 | HR 62 | Ht 70.0 in | Wt 190.8 lb

## 2016-04-11 DIAGNOSIS — I300 Acute nonspecific idiopathic pericarditis: Secondary | ICD-10-CM | POA: Insufficient documentation

## 2016-04-11 DIAGNOSIS — R011 Cardiac murmur, unspecified: Secondary | ICD-10-CM

## 2016-04-11 LAB — SEDIMENTATION RATE: Sed Rate: 4 mm/hr (ref 0–15)

## 2016-04-11 LAB — TROPONIN I

## 2016-04-11 LAB — C-REACTIVE PROTEIN: CRP: <0.8 mg/dL (ref <1.0)

## 2016-04-11 NOTE — Progress Notes (Signed)
Cardiology Office Note   Date:  04/11/2016   ID:  Dennis Hayes, DOB 09-12-1985, MRN [id number]  Referring Doctor:  No PCP Per Patient   Cardiologist:   Wende Bushy, MD   Reason for consultation:  Chief Complaint  Patient presents with  . other    C/o chest pain and shoulder pain. Meds reviewed verbally with pt.      History of Present Illness: Dennis Hayes is a 31 y.o. male who presents for Chest pain.  Review of records show: Patient presented to the ER on Monday, 01/31/2016 for chest pain. Apparently, it has been going on for quite some time now but he has not really paid much attention to it. On Monday, it was more persistent, sharp pain across the chest, not related to exertion or positional change, nonradiating, mild to moderate in intensity, lasting on and off throughout the day. He was found to have pericarditis on EKG. He was started on ibuprofen on colchicine and asked to follow-up with cardiology.  Since his last visit, he continues to have chest pain. Despite being on NSAIDs and colchicine for at least 2 months now, he does not feel that the chest pain is getting better. Again chest pain is nonexertional. He cannot think of anything that provokes the pain. It may be a little more noticeable when lying down but not consistently. It is still a sharp pain, 7 out of 10 at the worst. On and off throughout the day. It is not any worse than before but again has not significantly improved despite medications.  ROS:  Please see the history of present illness. Aside from mentioned under HPI, all other systems are reviewed and negative.     Past Medical History:  Diagnosis Date  . Asthma     History reviewed. No pertinent surgical history.   reports that he has never smoked. He has never used smokeless tobacco. He reports that he does not drink alcohol or use drugs.   family history includes Heart attack in his maternal grandmother; Hypertension in his maternal  grandfather.   Outpatient Medications Prior to Visit  Medication Sig Dispense Refill  . colchicine 0.6 MG tablet Take 1 tablet (0.6 mg total) by mouth 3 (three) times daily. 90 tablet 3  . cyclobenzaprine (FLEXERIL) 10 MG tablet Take 1 tablet (10 mg total) by mouth every 8 (eight) hours as needed for muscle spasms. 15 tablet 0  . ibuprofen (ADVIL,MOTRIN) 800 MG tablet Take 1 tablet (800 mg total) by mouth every 8 (eight) hours. 90 tablet 0  . naproxen (NAPROSYN) 500 MG tablet Take 1 tablet (500 mg total) by mouth 2 (two) times daily with a meal. 20 tablet 0  . omeprazole (PRILOSEC) 20 MG capsule TAKE ONE CAPSULE BY MOUTH EVERY DAY 30 capsule 0  . ondansetron (ZOFRAN) 4 MG tablet Take 1 tablet (4 mg total) by mouth every 8 (eight) hours as needed for nausea or vomiting. 20 tablet 0   No facility-administered medications prior to visit.    He is not taking Naprosyn  Allergies: Patient has no known allergies.    PHYSICAL EXAM: VS:  BP 100/78 (BP Location: Left Arm, Patient Position: Sitting, Cuff Size: Normal)   Pulse 62   Ht _0  (1.778 m)   Wt 190 lb 12 oz (86.5 kg)   BMI 27.37 kg/m  , Body mass index is 27.37 kg/m. Wt Readings from Last 3 Encounters:  04/11/16 190 lb 12 oz (86.5 kg)  04/04/16 190 lb (86.2 kg)  03/10/16 197 lb (89.4 kg)    GENERAL:  well developed, well nourished, not in acute distress HEENT: normocephalic, pink conjunctivae, anicteric sclerae, no xanthelasma, normal dentition, oropharynx clear NECK:  no neck vein engorgement, JVP normal, no hepatojugular reflux, carotid upstroke brisk and symmetric, no bruit, no thyromegaly, no lymphadenopathy LUNGS:  good respiratory effort, clear to auscultation bilaterally CV:  PMI not displaced, no thrills, no lifts, S1 and S2 within normal limits, no palpable S3 or S4, Soft systolic murmur, no rubs, no gallops ABD:  Soft, nontender, nondistended, normoactive bowel sounds, no abdominal aortic bruit, no hepatomegaly, no  splenomegaly MS: nontender back, no kyphosis, no scoliosis, no joint deformities EXT:  2+ DP/PT pulses, no edema, no varicosities, no cyanosis, no clubbing SKIN: warm, nondiaphoretic, normal turgor, no ulcers NEUROPSYCH: alert, oriented to person, place, and time, sensory/motor grossly intact, normal mood, appropriate affect  Recent Labs: 04/04/2016: ALT 21; BUN 10; Creatinine, Ser 0.98; Hemoglobin 15.8; Platelets 223; Potassium 4.0; Sodium 140   Lipid Panel No results found for: CHOL, TRIG, HDL, CHOLHDL, VLDL, LDLCALC, LDLDIRECT   Other studies Reviewed:  EKG:  The ekg from 02/03/2016 was personally reviewed by me and it revealed sinus rhythm, 67 BPM. Nonspecific ST-T wave changes. Compared to EKG from 01/31/2016, there is mild improvement and PR depression. EKG today 04/11/2016 was personally reviewed by me showed sinus rhythm, 62 BPM. Possible LVH. Either PR depression versus ST elevation this is with early repolarization.  Additional studies/ records that were reviewed personally reviewed by me today include:  Echo 03/01/2016: Left ventricle: The cavity size was normal. Wall thickness was   normal. Systolic function was normal. The estimated ejection   fraction was in the range of 55% to 60%. Wall motion was normal;   there were no regional wall motion abnormalities. Left   ventricular diastolic function parameters were normal.   ASSESSMENT AND PLAN:  Acute pericarditis EKG showing changes of possible pericarditis. It would be an atypical presentation of pericarditis since patient has not significant improvement despite NSAIDs and colchicine for about 2 months now. No pericardial effusion on echocardiogram. If you examined the EKG, it may either be very subtle true PR depression or ST elevation consistent with early repolarization that causes the PR to appear depressed. We have discussed the plan of action which would be a trial of prednisone. 50 mg every day for 2 weeks, 40 mg  daily times one week, 30 mg daily times one week, 20 g daily times one week, 10 mg daily times one week, 5 times daily times one week, then stop. Continue GI prophylaxis with Prilosec 20 daily. Check ESR, CRP, and troponin. Check CTA of the chest.  Systolic murmur. No significant valve problems noted  Current medicines are reviewed at length with the patient today.  The patient does not have concerns regarding medicines.  Labs/ tests ordered today include:  Orders Placed This Encounter  Procedures  . EKG 12-Lead    I had a lengthy and detailed discussion with the patient regarding diagnoses, prognosis, diagnostic options, treatment options , and side effects of medications.   I counseled the patient on importance of lifestyle modification including heart healthy diet, regular physical activity .   Disposition:   FU with undersigned In 2 months or sooner   Signed, Wende Bushy, MD  04/11/2016 11:36 AM    [place]  This note was generated in part with voice recognition software and I  apologize for any typographical errors that were not detected and corrected.

## 2016-04-11 NOTE — Patient Instructions (Addendum)
Medication Instructions:  Your physician has recommended you make the following change in your medication:  1. Prednisone take as directed 2. STOP taking Chochine and Ibuprofen  Labwork: Please go to [facility] to have labs done.  Testing/Procedures: Non-Cardiac CT Angiography (CTA), is a special type of CT scan that uses a computer to produce multi-dimensional views of major blood vessels throughout the body. In CT angiography, a contrast material is injected through an IV to help visualize the blood vessels   Scheduled for Wednesday April 12, 2016 at 09:00AM arrive at 08:45AM to register.  Location: [address], [place], [place] Please call if you need to reschedule [phone]   Follow-Up: Your physician recommends that you schedule a follow-up appointment in: 2 months with Dr. Alvino ChapelIngal.   It was a pleasure seeing you today here in the office. Please do not hesitate to give us a call back if you have any further questions. [phone]  Sparks CellarPamela A. RN, BSN

## 2016-04-11 NOTE — Telephone Encounter (Signed)
Forms received from [facility] at [facility]. Prescription and forms require physician signature and then to call when ready for pick up. Patient has appointment today to see Dr. Alvino ChapelIngal. Forms given to her for review.

## 2016-04-12 ENCOUNTER — Ambulatory Visit
Admission: RE | Admit: 2016-04-12 | Discharge: 2016-04-12 | Disposition: A | Discharge status: Home / Self Care | Payer: Self-pay | Source: Ambulatory Visit | Attending: Cardiology | Admitting: Cardiology

## 2016-04-12 DIAGNOSIS — I300 Acute nonspecific idiopathic pericarditis: Secondary | ICD-10-CM | POA: Insufficient documentation

## 2016-04-12 MED ORDER — IOPAMIDOL (ISOVUE-370) INJECTION 76%
100.0000 mL | Freq: Once | INTRAVENOUS | Status: AC | PRN
  Administered 2016-04-12: 100 mL via INTRAVENOUS

## 2016-05-28 ENCOUNTER — Encounter: Payer: Self-pay | Admitting: Emergency Medicine

## 2016-05-28 ENCOUNTER — Emergency Department
Admission: EM | Admit: 2016-05-28 | Discharge: 2016-05-28 | Disposition: A | Discharge status: Home / Self Care | Payer: Self-pay | Attending: Emergency Medicine | Admitting: Emergency Medicine

## 2016-05-28 DIAGNOSIS — J039 Acute tonsillitis, unspecified: Secondary | ICD-10-CM | POA: Insufficient documentation

## 2016-05-28 DIAGNOSIS — K122 Cellulitis and abscess of mouth: Secondary | ICD-10-CM | POA: Insufficient documentation

## 2016-05-28 DIAGNOSIS — J45909 Unspecified asthma, uncomplicated: Secondary | ICD-10-CM | POA: Insufficient documentation

## 2016-05-28 LAB — POCT RAPID STREP A: STREPTOCOCCUS, GROUP A SCREEN (DIRECT): NEGATIVE

## 2016-05-28 MED ORDER — PREDNISONE 10 MG PO TABS: 10.0000 mg | ORAL_TABLET | Freq: Every day | ORAL | 0 refills | Status: DC

## 2016-05-28 MED ORDER — AZITHROMYCIN 250 MG PO TABS: ORAL_TABLET | ORAL | 0 refills | Status: DC

## 2016-05-28 MED ORDER — RANITIDINE HCL 150 MG PO TABS: 150.0000 mg | ORAL_TABLET | Freq: Two times a day (BID) | ORAL | 0 refills | Status: DC

## 2016-05-28 MED ORDER — DEXAMETHASONE SODIUM PHOSPHATE 10 MG/ML IJ SOLN
10.0000 mg | Freq: Once | INTRAMUSCULAR | Status: AC
  Administered 2016-05-28: 10 mg via INTRAMUSCULAR
  Filled 2016-05-28: qty 1

## 2016-05-28 NOTE — Discharge Instructions (Signed)
Your exam and rapid test reveal a likely viral cause of your tonsillitis. Your enlarged uvula may also be viral or allergic. Treatment includes steroids. Take the prescription meds as directed. Gargle with warm, salty water daily. Follow-up with your provider or [facility] as needed.

## 2016-05-28 NOTE — ED Triage Notes (Signed)
Pt c/o sore throat for the past 2-3 days 

## 2016-05-30 LAB — CULTURE, GROUP A STREP (THRC)

## 2016-05-31 NOTE — ED Provider Notes (Signed)
[facility] Emergency Department Provider Note ____________________________________________  Time seen: 1016  I have reviewed the triage vital signs and the nursing notes.  HISTORY  Chief Complaint  Sore Throat  HPI Dennis Hayes is a 31 y.o. male visits to the ED for evaluation of sore throat last 3 days. Patient also describes some fullness to the back of the throat, that he describes as a slightly enlarged uvula. He is also noted some intermittent cough, and body aches. He had the onset of a headache yesterday. He describes previous episodes of uvulitis, but reports they spontaneously resolved. He denies any interim fevers, chills, sweats, nausea, or vomiting. He does note some pain with swallowing, but overall is been able to control his oral secretions, and continue to drink and eat. Patient is currently on a long taper of prednisone for a concurrent treatment of pericarditis. He describes a the last week has been taking one half of a daily tap that he's been tapering over the last 3 weeks.  Past Medical History:  Diagnosis Date  . Asthma     There are no active problems to display for this patient.   History reviewed. No pertinent surgical history.  Prior to Admission medications   Medication Sig Start Date End Date Taking? Authorizing Provider  azithromycin (ZITHROMAX Z-PAK) 250 MG tablet Take 2 tablets (500 mg) on  Day 1,  followed by 1 tablet (250 mg) once daily on Days 2 through 5. 05/28/16   Joline Encalada V Bacon Houston Zapien, PA-C  cyclobenzaprine (FLEXERIL) 10 MG tablet Take 1 tablet (10 mg total) by mouth every 8 (eight) hours as needed for muscle spasms. 03/29/15   Joni Reiningonald K Smith, PA-C  naproxen (NAPROSYN) 500 MG tablet Take 1 tablet (500 mg total) by mouth 2 (two) times daily with a meal. 03/11/16   Sharman CheekPhillip Stafford, MD  omeprazole (PRILOSEC) 20 MG capsule TAKE ONE CAPSULE BY MOUTH EVERY DAY 04/06/16   Almond LintAileen Ingal, MD  ondansetron (ZOFRAN) 4 MG tablet Take 1  tablet (4 mg total) by mouth every 8 (eight) hours as needed for nausea or vomiting. 04/04/16   Nita Sicklearolina Veronese, MD  predniSONE (DELTASONE) 10 MG tablet Take 1 tablet (10 mg total) by mouth daily with breakfast. 05/28/16   Charlesetta IvoryJenise V Bacon Yanixan Mellinger, PA-C  ranitidine (ZANTAC) 150 MG tablet Take 1 tablet (150 mg total) by mouth 2 (two) times daily. 05/28/16   Charlesetta IvoryJenise V Bacon Newt Levingston, PA-C   Allergies Patient has no known allergies.  Family History  Problem Relation Age of Onset  . Heart attack Maternal Grandmother   . Hypertension Maternal Grandfather     Social History Social History  Substance Use Topics  . Smoking status: Never Smoker  . Smokeless tobacco: Never Used  . Alcohol use No    Review of Systems  Constitutional: Negative for fever. Eyes: Negative for visual changes. ENT: Positive for sore throat and uvula swelling. Cardiovascular: Negative for chest pain. Respiratory: Negative for shortness of breath. Gastrointestinal: Negative for abdominal pain, vomiting and diarrhea. Musculoskeletal: Negative for back pain. Skin: Negative for rash. Neurological: Negative for headaches, focal weakness or numbness. ____________________________________________  PHYSICAL EXAM:  VITAL SIGNS: ED Triage Vitals  Enc Vitals Group     BP 05/28/16 0929 131/77     Pulse Rate 05/28/16 0929 76     Resp 05/28/16 0929 16     Temp 05/28/16 0929 98.3 F (36.8 C)     Temp Source 05/28/16 0929 Oral  SpO2 05/28/16 0929 97 %     Weight 05/28/16 0930 192 lb (87.1 kg)     Height 05/28/16 0930 5\' 10"  (1.778 m)     Head Circumference --      Peak Flow --      Pain Score 05/28/16 0930 7     Pain Loc --      Pain Edu? --      Excl. in GC? --    Constitutional: Alert and oriented. Well appearing and in no distress. Head: Normocephalic and atraumatic. Eyes: Conjunctivae are normal. PERRL. Normal extraocular movements Ears: Canals clear. TMs intact bilaterally. Nose: No  congestion/rhinorrhea/epistaxis. Mouth/Throat: Mucous membranes are moist. Uvula is midline but is slightly edematous was inferior pole. Patient is able to control her secretions and there are no oral lesions appreciated. The tonsils appear mildly edematous and injected but no tonsillar lesions are appreciated.  Neck: Supple. No thyromegaly. Hematological/Lymphatic/Immunological: No cervical lymphadenopathy. Cardiovascular: Normal rate, regular rhythm. Normal distal pulses. Respiratory: Normal respiratory effort. No wheezes/rales/rhonchi. Gastrointestinal: Soft and nontender. No distention. Musculoskeletal: Nontender with normal range of motion in all extremities.  Neurologic:  Normal gait without ataxia. Normal speech and language. No gross focal neurologic deficits are appreciated. Skin:  Skin is warm, dry and intact. No rash noted. ____________________________________________   LABS (pertinent positives/negatives) Labs Reviewed  CULTURE, GROUP A STREP [facility])  POCT RAPID STREP A  ____________________________________________  PROCEDURES  Decadron 10 mg IM ____________________________________________  INITIAL IMPRESSION / ASSESSMENT AND PLAN / ED COURSE  Patient with a presentation of an acute tonsillitis and uvulitis which may represent an allergic or viral etiology. He is given an IM injection of Decadron in the ED. He is discharged at this time with a prescription for azithromycin to dose as directed. If throat culture is pending at the time of discharge. He should follow up with ENT for ongoing worsening symptoms. Return precautions are reviewed. ____________________________________________  FINAL CLINICAL IMPRESSION(S) / ED DIAGNOSES  Final diagnoses:  Uvulitis  Tonsillitis      Lissa Hoard, PA-C 05/31/16 2336    Myrna Blazer, MD 06/02/16 [phone]

## 2016-06-13 ENCOUNTER — Ambulatory Visit: Payer: Self-pay | Admitting: Cardiology

## 2016-06-15 ENCOUNTER — Encounter: Payer: Self-pay | Admitting: Cardiology

## 2016-06-15 ENCOUNTER — Ambulatory Visit (INDEPENDENT_AMBULATORY_CARE_PROVIDER_SITE_OTHER): Payer: Self-pay | Admitting: Cardiology

## 2016-06-15 VITALS — BP 120/84 | HR 81 | Ht 70.0 in | Wt 197.0 lb

## 2016-06-15 DIAGNOSIS — R079 Chest pain, unspecified: Secondary | ICD-10-CM

## 2016-06-15 NOTE — Progress Notes (Signed)
  Cardiology Office Note   Date:  06/15/2016   ID:  Dennis Hayes, DOB 01/02/1986, MRN [id number]  Referring Doctor:  No PCP Per Patient   Cardiologist:   Aileen Ingal, MD   Reason for consultation:  Chief Complaint  Patient presents with  . other    2 month f/u CT Scan c/o chest pain "feels same as before". Meds reviewed verbally with pt.      History of Present Illness: Dennis Hayes is a 31 y.o. male who presents for ffup  For CP  Review of records show: Patient presented to the ER on Monday, 01/31/2016 for chest pain. Apparently, it has been going on for quite some time now but he has not really paid much attention to it. On Monday, it was more persistent, sharp pain across the chest, not related to exertion or positional change, nonradiating, mild to moderate in intensity, lasting on and off throughout the day. He was found to have pericarditis on EKG. He was started on ibuprofen on colchicine and asked to follow-up with cardiology.  He was on several months duration of NSAIDs, colchicine. Finally, on his last visit in January 2018, he was tried on a course of steroids.  Patient reports that he continues to have chest pain, but less intense and less frequent than before. More randomly occurring. He is no longer taking the NSAIDs. He still remains on the Prilosec.  ROS:  Please see the history of present illness. Aside from mentioned under HPI, all other systems are reviewed and negative.    Past Medical History:  Diagnosis Date  . Asthma     History reviewed. No pertinent surgical history.   reports that he has never smoked. He has never used smokeless tobacco. He reports that he does not drink alcohol or use drugs.   family history includes Heart attack in his maternal grandmother; Hypertension in his maternal grandfather.   Outpatient Medications Prior to Visit  Medication Sig Dispense Refill  . naproxen (NAPROSYN) 500 MG tablet Take 1 tablet (500 mg total) by  mouth 2 (two) times daily with a meal. 20 tablet 0  . omeprazole (PRILOSEC) 20 MG capsule TAKE ONE CAPSULE BY MOUTH EVERY DAY 30 capsule 0  . ranitidine (ZANTAC) 150 MG tablet Take 1 tablet (150 mg total) by mouth 2 (two) times daily. 20 tablet 0  . azithromycin (ZITHROMAX Z-PAK) 250 MG tablet Take 2 tablets (500 mg) on  Day 1,  followed by 1 tablet (250 mg) once daily on Days 2 through 5. (Patient not taking: Reported on 06/15/2016) 6 each 0  . cyclobenzaprine (FLEXERIL) 10 MG tablet Take 1 tablet (10 mg total) by mouth every 8 (eight) hours as needed for muscle spasms. (Patient not taking: Reported on 06/15/2016) 15 tablet 0  . ondansetron (ZOFRAN) 4 MG tablet Take 1 tablet (4 mg total) by mouth every 8 (eight) hours as needed for nausea or vomiting. (Patient not taking: Reported on 06/15/2016) 20 tablet 0  . predniSONE (DELTASONE) 10 MG tablet Take 1 tablet (10 mg total) by mouth daily with breakfast. (Patient not taking: Reported on 06/15/2016) 5 tablet 0   No facility-administered medications prior to visit.    He is not taking Naprosyn  Allergies: Patient has no known allergies.    PHYSICAL EXAM: VS:  BP 120/84 (BP Location: Left Arm, Patient Position: Sitting, Cuff Size: Normal)   Pulse 81   Ht 5' 10" (1.778 m)   Wt 197 lb (89.4   kg)   BMI 28.27 kg/m  , Body mass index is 28.27 kg/m. Wt Readings from Last 3 Encounters:  06/15/16 197 lb (89.4 kg)  05/28/16 192 lb (87.1 kg)  04/11/16 190 lb 12 oz (86.5 kg)  GENERAL:  well developed, well nourished, obese, not in acute distress HEENT: normocephalic, pink conjunctivae, anicteric sclerae, no xanthelasma, normal dentition, oropharynx clear NECK:  no neck vein engorgement, JVP normal, no hepatojugular reflux, carotid upstroke brisk and symmetric, no bruit, no thyromegaly, no lymphadenopathy LUNGS:  good respiratory effort, clear to auscultation bilaterally CV:  PMI not displaced, no thrills, no lifts, S1 and S2 within normal limits, no  palpable S3 or S4, no murmurs, no rubs, no gallops ABD:  Soft, nontender, nondistended, normoactive bowel sounds, no abdominal aortic bruit, no hepatomegaly, no splenomegaly MS: nontender back, no kyphosis, no scoliosis, no joint deformities EXT:  2+ DP/PT pulses, no edema, no varicosities, no cyanosis, no clubbing SKIN: warm, nondiaphoretic, normal turgor, no ulcers NEUROPSYCH: alert, oriented to person, place, and time, sensory/motor grossly intact, normal mood, appropriate affect  Recent Labs: 04/04/2016: ALT 21; BUN 10; Creatinine, Ser 0.98; Hemoglobin 15.8; Platelets 223; Potassium 4.0; Sodium 140   Lipid Panel No results found for: CHOL, TRIG, HDL, CHOLHDL, VLDL, LDLCALC, LDLDIRECT   Other studies Reviewed:  EKG:  The ekg from 02/03/2016 was personally reviewed by me and it revealed sinus rhythm, 67 BPM. Nonspecific ST-T wave changes. Compared to EKG from 01/31/2016, there is mild improvement and PR depression.  EKG today 04/11/2016 was personally reviewed by me showed sinus rhythm, 62 BPM. Possible LVH. Either PR depression versus ST elevation this is with early repolarization.  Additional studies/ records that were reviewed personally reviewed by me today include:  Echo 03/01/2016: Left ventricle: The cavity size was normal. Wall thickness was   normal. Systolic function was normal. The estimated ejection   fraction was in the range of 55% to 60%. Wall motion was normal;   there were no regional wall motion abnormalities. Left   ventricular diastolic function parameters were normal.   ASSESSMENT AND PLAN: Chest pain, atypical Acute pericarditis, ? Accuracy of the diagnosis Initial EKG showing changes of possible pericarditis. As per last office visit 04/11/2016, It would be an atypical presentation of pericarditis since patient has not significant improvement despite NSAIDs and colchicine for about 2 months and nose resolution after steroid course. As previously mentioned, no  pericardial effusion noted on echocardiogram. If you examined the EKG, it may either be very subtle true PR depression or ST elevation consistent with early repolarization that causes the PR to appear depressed. No evidence of inflammation on blood work. ESR, CRP, and troponin -wnl 04/11/2016. CTA of the chest - unremarkable 04/12/2016 I had a lengthy and detailed discussion with the patient regarding the possible diagnosis. Again reiterated that most likely, this is not the case of pericarditis. We will set him up for stress echo to rule out ischemia, he has atypical chest pain though. Patient verbalized understanding and agreed with plan. Advised to follow-up with her PCP as well.  Systolic murmur. No significant valve problems noted  Current medicines are reviewed at length with the patient today.  The patient does not have concerns regarding medicines.  Labs/ tests ordered today include:  Orders Placed This Encounter  Procedures  . ECHOCARDIOGRAM STRESS TEST    I had a lengthy and detailed discussion with the patient regarding diagnoses, prognosis, diagnostic options, treatment options , and side effects of medications.  I counseled the patient on importance of lifestyle modification including heart healthy diet, regular physical activity .  I spent at least 25 minutes with the patient today and more than 50% of the time was spent counseling the patient and coordinating care.   Disposition:   FU with Cardiology after tests  Signed, Wende Bushy, MD  06/15/2016 2:00 PM    [place]  This note was generated in part with voice recognition software and I apologize for any typographical errors that were not detected and corrected.

## 2016-06-15 NOTE — Patient Instructions (Addendum)
Testing/Procedures: Your physician has requested that you have a stress echocardiogram. For further information please visit [email or web address] Please follow instruction sheet as given.   Do not drink or eat foods with caffeine for 24 hours before the test. (Chocolate, coffee, tea, or energy drinks)  If you use an inhaler, bring it with you to the test.  Do not smoke for 4 hours before the test.  Wear comfortable shoes and clothing.   Follow-Up: Your physician recommends that you schedule a follow-up appointment as needed. We will call you with your test results.   It was a pleasure seeing you today here in the office. Please do not hesitate to give [place] a call back if you have any further questions. [phone]  [place] Cellar RN, BSN     Exercise Stress Echocardiogram An exercise stress echocardiogram is a test that checks how well your heart is working. For this test, you will walk on a treadmill to make your heart beat faster. This test uses sound waves (ultrasound) and a computer to make pictures (images) of your heart. These pictures will be taken before you exercise and after you exercise. What happens before the procedure?  Follow instructions from your doctor about what you cannot eat or drink before the test.  Do not drink or eat anything that has caffeine in it. Stop having caffeine for 24 hours before the test.  Ask your doctor about changing or stopping your normal medicines. This is important if you take diabetes medicines or blood thinners. Ask your doctor if you should take your medicines with water before the test.  If you use an inhaler, bring it to the test.  Do not use any products that have nicotine or tobacco in them, such as cigarettes and e-cigarettes. Stop using them for 4 hours before the test. If you need help quitting, ask your doctor.  Wear comfortable shoes and clothing. What happens during the procedure?  You will be hooked up to a TV screen. Your  doctor will watch the screen to see how fast your heart beats during the test.  Before you exercise, a computer will make a picture of your heart. To do this:  A gel will be put on your chest.  A wand will be moved over the gel.  Sound waves from the wand will go to the computer to make the picture.  Your will start walking on a treadmill. The treadmill will start at a slow speed. It will get faster a little bit at a time. When you walk faster, your heart will beat faster.  The treadmill will be stopped when your heart is working hard.  You will lie down right away so another picture of your heart can be taken.  The test will take 30-60 minutes. What happens after the procedure?  Your heart rate and blood pressure will be watched after the test.  If your doctor says that you can, you may:  Eat what you usually eat.  Do your normal activities.  Take medicines like normal. Summary  An exercise stress echocardiogram is a test that checks how well your heart is working.  Follow instructions about what you cannot eat or drink before the test. Ask your doctor if you should take your normal medicines before the test.  Stop having caffeine for 24 hours before the test. Do not use anything with nicotine or tobacco in it for 4 hours before the test.  A computer will take a picture of your heart  before you walk on a treadmill. It will take another picture when you are done walking.  Your heart rate and blood pressure will be watched after the test. This information is not intended to replace advice given to you by your health care provider. Make sure you discuss any questions you have with your health care provider. Document Released: 01/15/2009 Document Revised: 12/12/2015 Document Reviewed: 12/12/2015 Elsevier Interactive Patient Education  2017 [employer]

## 2016-07-20 ENCOUNTER — Ambulatory Visit (INDEPENDENT_AMBULATORY_CARE_PROVIDER_SITE_OTHER): Payer: Self-pay

## 2016-07-20 DIAGNOSIS — R079 Chest pain, unspecified: Secondary | ICD-10-CM

## 2016-07-20 LAB — ECHOCARDIOGRAM STRESS TEST
CHL CUP MPHR: 190 {beats}/min
CHL CUP RESTING HR STRESS: 82 {beats}/min
CSEPPHR: 184 {beats}/min
Estimated workload: 13.4 METS
Exercise duration (min): 11 min
Exercise duration (sec): 13 s
Percent HR: 96 %

## 2017-05-05 ENCOUNTER — Emergency Department: Payer: Self-pay

## 2017-05-05 ENCOUNTER — Other Ambulatory Visit: Payer: Self-pay

## 2017-05-05 DIAGNOSIS — R531 Weakness: Secondary | ICD-10-CM | POA: Insufficient documentation

## 2017-05-05 DIAGNOSIS — J45909 Unspecified asthma, uncomplicated: Secondary | ICD-10-CM | POA: Insufficient documentation

## 2017-05-05 DIAGNOSIS — R0602 Shortness of breath: Secondary | ICD-10-CM | POA: Insufficient documentation

## 2017-05-05 DIAGNOSIS — R110 Nausea: Secondary | ICD-10-CM | POA: Insufficient documentation

## 2017-05-05 DIAGNOSIS — R079 Chest pain, unspecified: Secondary | ICD-10-CM | POA: Insufficient documentation

## 2017-05-05 DIAGNOSIS — Z79899 Other long term (current) drug therapy: Secondary | ICD-10-CM | POA: Insufficient documentation

## 2017-05-05 LAB — CBC
HCT: 44.5 % (ref 40.0–52.0)
Hemoglobin: 15.4 g/dL (ref 13.0–18.0)
MCH: 29.7 pg (ref 26.0–34.0)
MCHC: 34.5 g/dL (ref 32.0–36.0)
MCV: 86 fL (ref 80.0–100.0)
Platelets: 254 10*3/uL (ref 150–440)
RBC: 5.17 MIL/uL (ref 4.40–5.90)
RDW: 12.9 % (ref 11.5–14.5)
WBC: 10.8 10*3/uL — AB (ref 3.8–10.6)

## 2017-05-05 LAB — BASIC METABOLIC PANEL
Anion gap: 11 (ref 5–15)
BUN: 12 mg/dL (ref 6–20)
CHLORIDE: 102 mmol/L (ref 101–111)
CO2: 23 mmol/L (ref 22–32)
Calcium: 9.4 mg/dL (ref 8.9–10.3)
Creatinine, Ser: 0.89 mg/dL (ref 0.61–1.24)
GFR calc non Af Amer: >60 mL/min (ref >60)
Glucose, Bld: 105 mg/dL — ABNORMAL HIGH (ref 65–99)
POTASSIUM: 3.4 mmol/L — AB (ref 3.5–5.1)
SODIUM: 136 mmol/L (ref 135–145)

## 2017-05-05 LAB — TROPONIN I

## 2017-05-05 NOTE — ED Triage Notes (Signed)
Patient reports chest pain off/on since 9 am today.  Reports pain to left chest into left shoulder.

## 2017-05-05 NOTE — ED Notes (Signed)
Pt arrived EMS from home with c/o chest pain; pt has received 4-81mg  aspirin, 2 nitro and has an 18 gauge IV already placed

## 2017-05-06 ENCOUNTER — Emergency Department
Admission: EM | Admit: 2017-05-06 | Discharge: 2017-05-06 | Disposition: A | Discharge status: Home / Self Care | Payer: Self-pay | Attending: Emergency Medicine | Admitting: Emergency Medicine

## 2017-05-06 DIAGNOSIS — R079 Chest pain, unspecified: Secondary | ICD-10-CM

## 2017-05-06 LAB — TROPONIN I

## 2017-05-06 LAB — FIBRIN DERIVATIVES D-DIMER (ARMC ONLY): Fibrin derivatives D-dimer (ARMC): 455.56 ng/mL (FEU) (ref 0.00–499.00)

## 2017-05-06 LAB — CK: Total CK: 126 U/L (ref 49–397)

## 2017-05-06 MED ORDER — KETOROLAC TROMETHAMINE 30 MG/ML IJ SOLN
10.0000 mg | Freq: Once | INTRAMUSCULAR | Status: AC
  Administered 2017-05-06: 9.9 mg via INTRAVENOUS
  Filled 2017-05-06: qty 1

## 2017-05-06 NOTE — ED Provider Notes (Signed)
[facility] Emergency Department Provider Note   ____________________________________________   First MD Initiated Contact with Patient 05/06/17 [phone]     (approximate)  I have reviewed the triage vital signs and the nursing notes.   HISTORY  Chief Complaint Chest Pain    HPI Len BlalockJohn D Vandeventer is a 32 y.o. male who presents to the ED from home via EMS with of chest pain.  Patient reports left-sided chest pain onset approximately 8 AM yesterday.  He was at work and a group home.  Describes left-sided sharp/squeezing type pain radiating to his left arm back.  Denies associated diaphoresis or vomiting.  States he was nauseous, short of breath and feeling generally weak.  Pain is worsened with movement and palpation.  Denies recent fever, chills, cough, congestion, abdominal pain, dysuria, diarrhea.  Denies recent travel, trauma or hormone use.  Received aspirin and nitro x 2 per EMS prior to arrival.   Past Medical History:  Diagnosis Date  . Asthma     There are no active problems to display for this patient.   No past surgical history on file.  Prior to Admission medications   Medication Sig Start Date End Date Taking? Authorizing Provider  naproxen (NAPROSYN) 500 MG tablet Take 1 tablet (500 mg total) by mouth 2 (two) times daily with a meal. 03/11/16   Sharman CheekStafford, Phillip, MD  omeprazole (PRILOSEC) 20 MG capsule TAKE ONE CAPSULE BY MOUTH EVERY DAY 04/06/16   Almond LintIngal, Aileen, MD  ranitidine (ZANTAC) 150 MG tablet Take 1 tablet (150 mg total) by mouth 2 (two) times daily. 05/28/16   Menshew, Charlesetta IvoryJenise V Bacon, PA-C    Allergies Patient has no known allergies.  Family History  Problem Relation Age of Onset  . Heart attack Maternal Grandmother   . Hypertension Maternal Grandfather   Grandmother with CAD  Social History Social History   Tobacco Use  . Smoking status: Never Smoker  . Smokeless tobacco: Never Used  Substance Use Topics  . Alcohol use: No  .  Drug use: No  Denies illicit drug use.  Review of Systems  Constitutional: No fever/chills. Eyes: No visual changes. ENT: No sore throat. Cardiovascular: Positive for chest pain. Respiratory: Positive for shortness of breath. Gastrointestinal: No abdominal pain.  Positive for nausea, no vomiting.  No diarrhea.  No constipation. Genitourinary: Negative for dysuria. Musculoskeletal: Negative for back pain. Skin: Negative for rash. Neurological: Negative for headaches, focal weakness or numbness.   ____________________________________________   PHYSICAL EXAM:  VITAL SIGNS: ED Triage Vitals  Enc Vitals Group     BP 05/05/17 2025 116/88     Pulse Rate 05/05/17 2025 72     Resp 05/05/17 2025 18     Temp 05/05/17 2025 97.9 F (36.6 C)     Temp Source 05/05/17 2025 Oral     SpO2 05/05/17 2025 98 %     Weight 05/05/17 2023 205 lb (93 kg)     Height 05/05/17 2023 5\' 10"  (1.778 m)     Head Circumference --      Peak Flow --      Pain Score 05/05/17 2221 5     Pain Loc --      Pain Edu? --      Excl. in GC? --     Constitutional: Alert and oriented. Well appearing and in no acute distress. Eyes: Conjunctivae are normal. PERRL. EOMI. Head: Atraumatic. Nose: No congestion/rhinnorhea. Mouth/Throat: Mucous membranes are moist.  Oropharynx non-erythematous. Neck: No  stridor.  No carotid bruits. Cardiovascular: Normal rate, regular rhythm. Grossly normal heart sounds.  Good peripheral circulation. Respiratory: Normal respiratory effort.  No retractions. Lungs CTAB.  Left anterior chest tender to palpation and with movement of trunk. Gastrointestinal: Soft and nontender. No distention. No abdominal bruits. No CVA tenderness. Musculoskeletal: No lower extremity tenderness nor edema.  No calf pain, tenderness or swelling.  No joint effusions. Neurologic:  Normal speech and language. No gross focal neurologic deficits are appreciated. No gait instability. Skin:  Skin is warm, dry and  intact. No rash noted. Psychiatric: Mood and affect are normal. Speech and behavior are normal.  ____________________________________________   LABS (all labs ordered are listed, but only abnormal results are displayed)  Labs Reviewed  BASIC METABOLIC PANEL - Abnormal; Notable for the following components:      Result Value   Potassium 3.4 (*)    Glucose, Bld 105 (*)    All other components within normal limits  CBC - Abnormal; Notable for the following components:   WBC 10.8 (*)    All other components within normal limits  TROPONIN I  TROPONIN I  FIBRIN DERIVATIVES D-DIMER ([facility] ONLY)  CK   ____________________________________________  EKG  ED ECG REPORT I, Anajah Sterbenz J, the attending physician, personally viewed and interpreted this ECG.   Date: 05/06/2017  EKG Time: 2025  Rate: 67  Rhythm: normal EKG, normal sinus rhythm  Axis: Normal  Intervals:none  ST&T Change: Nonspecific  ____________________________________________  RADIOLOGY  ED MD interpretation: No acute cardiopulmonary process  Official radiology report(s): Dg Chest 2 View  Result Date: 05/05/2017 CLINICAL DATA:  Patient reports chest pain off/on since 9 am today. Reports pain to left chest into left shoulder EXAM: CHEST  2 VIEW COMPARISON:  01/31/2016 FINDINGS: The heart size and mediastinal contours are within normal limits. Both lungs are clear. The visualized skeletal structures are unremarkable. IMPRESSION: No active cardiopulmonary disease. Electronically Signed   By: Norva Pavlov M.D.   On: 05/05/2017 21:00    ____________________________________________   PROCEDURES  Procedure(s) performed: None  Procedures  Critical Care performed: No  ____________________________________________   INITIAL IMPRESSION / ASSESSMENT AND PLAN / ED COURSE  As part of my medical decision making, I reviewed the following data within the electronic MEDICAL RECORD NUMBER History obtained from family, Nursing  notes reviewed and incorporated, Labs reviewed, EKG interpreted, Radiograph reviewed  and Notes from prior ED visits.   32 year old male who presents chest pain since 8 AM. Differential diagnosis includes, but is not limited to, ACS, aortic dissection, pulmonary embolism, cardiac tamponade, pneumothorax, pneumonia, pericarditis, myocarditis, GI-related causes including esophagitis/gastritis, and musculoskeletal chest wall pain.    Initial laboratory results including troponin, EKG and chest x-ray unremarkable.  Will repeat timed troponin, d-dimer and check CK.  Patient currently resting without distress.  Clinical Course as of May 07 715  Wynelle Link May 06, 2017  4010 Patient sleeping in no acute distress.  Updated him negative repeat troponin, negative d-dimer and normal CK.  Will follow up with cardiology early next week.  Strict return precautions given.  Patient verbalizes understanding and agrees with plan of care.  [JS]    Clinical Course User Index [JS] Irean Hong, MD     ____________________________________________   FINAL CLINICAL IMPRESSION(S) / ED DIAGNOSES  Final diagnoses:  Nonspecific chest pain     ED Discharge Orders    None       Note:  This document was prepared using Dragon voice recognition  software and may include unintentional dictation errors.    Irean Hong, MD 05/06/17 [phone]

## 2017-05-06 NOTE — Discharge Instructions (Signed)
1.  You may take Tylenol and/or Ibuprofen as needed for discomfort. °2.  Apply moist heat to affected area several times daily. °3.  Return to the ER for worsening symptoms, persistent vomiting, difficulty breathing or other concerns. °

## 2017-05-06 NOTE — ED Notes (Signed)
Reviewed discharge instructions, follow-up care, and OTC pain medications with patient. Patient verbalized understanding of all information reviewed. Patient stable, with no distress noted at this time.    

## 2017-05-06 NOTE — ED Notes (Signed)
Patient c/o left chest pain described as sharp/squeezing beginning at 0800 2/2. Patient reports pain radiates to left arm, and yesterday radiated to back and neck. Patient reports accompanying symptoms of weakness, SOB, and nausea. Patient denies syncope, emesis, and dizziness.

## 2017-06-01 ENCOUNTER — Emergency Department
Admission: EM | Admit: 2017-06-01 | Discharge: 2017-06-01 | Disposition: A | Discharge status: Home / Self Care | Payer: Self-pay | Attending: Emergency Medicine | Admitting: Emergency Medicine

## 2017-06-01 ENCOUNTER — Other Ambulatory Visit: Payer: Self-pay

## 2017-06-01 ENCOUNTER — Encounter: Payer: Self-pay | Admitting: Emergency Medicine

## 2017-06-01 DIAGNOSIS — J45909 Unspecified asthma, uncomplicated: Secondary | ICD-10-CM | POA: Insufficient documentation

## 2017-06-01 DIAGNOSIS — L03114 Cellulitis of left upper limb: Secondary | ICD-10-CM | POA: Insufficient documentation

## 2017-06-01 DIAGNOSIS — Z79899 Other long term (current) drug therapy: Secondary | ICD-10-CM | POA: Insufficient documentation

## 2017-06-01 MED ORDER — NAPROXEN 500 MG PO TABS: 500.0000 mg | ORAL_TABLET | Freq: Two times a day (BID) | ORAL | 0 refills | Status: DC

## 2017-06-01 MED ORDER — SULFAMETHOXAZOLE-TRIMETHOPRIM 800-160 MG PO TABS: 1.0000 | ORAL_TABLET | Freq: Two times a day (BID) | ORAL | 0 refills | Status: DC

## 2017-06-01 NOTE — ED Provider Notes (Signed)
[facility] Emergency Department Provider Note ____________________________________________  Time seen: Approximately 11:13 AM  I have reviewed the triage vital signs and the nursing notes.   HISTORY  Chief Complaint Arm Pain    HPI Dennis Hayes is a 32 y.o. male who presents to the emergency department for treatment and evaluation of left arm pain that started over a week ago. He states he donated plasma and they had a hard time finding a vein. He reports that they used the same needle and stuck him multiple times. Since that time, his arm has been swollen and painful. Over the past 24 hours, the swelling, erythema and pain have moved up the back of the arm and down toward the wrist. No known fever.   Past Medical History:  Diagnosis Date  . Asthma     There are no active problems to display for this patient.   History reviewed. No pertinent surgical history.  Prior to Admission medications   Medication Sig Start Date End Date Taking? Authorizing Provider  naproxen (NAPROSYN) 500 MG tablet Take 1 tablet (500 mg total) by mouth 2 (two) times daily with a meal. 06/01/17   Maksym Pfiffner B, FNP  omeprazole (PRILOSEC) 20 MG capsule TAKE ONE CAPSULE BY MOUTH EVERY DAY 04/06/16   Almond Lint, MD  ranitidine (ZANTAC) 150 MG tablet Take 1 tablet (150 mg total) by mouth 2 (two) times daily. 05/28/16   Menshew, Charlesetta Ivory, PA-C  sulfamethoxazole-trimethoprim (BACTRIM DS,SEPTRA DS) 800-160 MG tablet Take 1 tablet by mouth 2 (two) times daily. 06/01/17   Chinita Pester, FNP    Allergies Patient has no known allergies.  Family History  Problem Relation Age of Onset  . Heart attack Maternal Grandmother   . Hypertension Maternal Grandfather     Social History Social History   Tobacco Use  . Smoking status: Never Smoker  . Smokeless tobacco: Never Used  Substance Use Topics  . Alcohol use: No  . Drug use: No    Review of Systems Constitutional:  Negative for fever. Cardiovascular: Negative for chest pain. Respiratory: Negative for shortness of breath. Musculoskeletal: Positive for left arm pain. Skin: Positive for bruising, erythema, and swelling of the left arm.  Neurological: Positive for occasional paresthesias.  ____________________________________________   PHYSICAL EXAM:  VITAL SIGNS: ED Triage Vitals [06/01/17 1008]  Enc Vitals Group     BP (!) 110/57     Pulse Rate 75     Resp 20     Temp 98.1 F (36.7 C)     Temp Source Oral     SpO2 98 %     Weight 205 lb (93 kg)     Height 5\' 10"  (1.778 m)     Head Circumference      Peak Flow      Pain Score 7     Pain Loc      Pain Edu?      Excl. in GC?     Constitutional: Alert and oriented. Well appearing and in no acute distress. Eyes: Conjunctivae are clear without discharge or drainage Head: Atraumatic Neck: Supple Respiratory: Respirations are even and unlabored. Musculoskeletal: full, active ROM of the left arm demonstrated. No joint pain or bony deformity. Neurologic: Motor and sensory function is intact.  Skin: Erythema extending from mid tricep area to the distal aspect of the anterior forearm with late stage ecchymosis over the [facility] and anterior forearm. Psychiatric: Affect and behavior are appropriate  ____________________________________________  LABS (all labs ordered are listed, but only abnormal results are displayed)  Labs Reviewed - No data to display ____________________________________________  RADIOLOGY  Not indicated ____________________________________________   PROCEDURES  Procedures  ____________________________________________   INITIAL IMPRESSION / ASSESSMENT AND PLAN / ED COURSE  Dennis Hayes is a 32 y.o. male who presents to the emergency department for evaluation and treatment of left arm pain after donating plasma over a week ago.  Differential diagnosis would also include deep vein thrombosis, thrombophlebitis, or  contusion.  Based on symptoms and exam, greatest concern at this time for cellulitis and he will be treated with Bactrim and Naprosyn.  Medications - No data to display  Pertinent labs & imaging results that were available during my care of the patient were reviewed by me and considered in my medical decision making (see chart for details).  _________________________________________   FINAL CLINICAL IMPRESSION(S) / ED DIAGNOSES  Final diagnoses:  Cellulitis of left upper extremity    ED Discharge Orders        Ordered    sulfamethoxazole-trimethoprim (BACTRIM DS,SEPTRA DS) 800-160 MG tablet  2 times daily     06/01/17 1128    naproxen (NAPROSYN) 500 MG tablet  2 times daily with meals     06/01/17 1128       If controlled substance prescribed during this visit, 12 month history viewed on the [facility] prior to issuing an initial prescription for Schedule II or III opiod.    Chinita Pesterriplett, Antino Mayabb B, FNP 06/01/17 1202    Minna AntisPaduchowski, Kevin, MD 06/01/17 1440

## 2017-06-01 NOTE — ED Notes (Signed)
First nurse note  Presents with left arm pain and swelling  Noticed after donating plasma

## 2017-06-01 NOTE — ED Triage Notes (Signed)
Presents with pain and swelling to left arm  States he noticed after donating plasma  Min swelling noted with slight bruising

## 2018-03-04 ENCOUNTER — Encounter: Payer: Self-pay | Admitting: Emergency Medicine

## 2018-03-04 ENCOUNTER — Other Ambulatory Visit: Payer: Self-pay

## 2018-03-04 ENCOUNTER — Emergency Department
Admission: EM | Admit: 2018-03-04 | Discharge: 2018-03-04 | Disposition: A | Discharge status: Home / Self Care | Payer: Self-pay | Attending: Emergency Medicine | Admitting: Emergency Medicine

## 2018-03-04 DIAGNOSIS — Z79899 Other long term (current) drug therapy: Secondary | ICD-10-CM | POA: Insufficient documentation

## 2018-03-04 DIAGNOSIS — G8929 Other chronic pain: Secondary | ICD-10-CM

## 2018-03-04 DIAGNOSIS — R51 Headache: Secondary | ICD-10-CM | POA: Insufficient documentation

## 2018-03-04 MED ORDER — KETOROLAC TROMETHAMINE 10 MG PO TABS
10.0000 mg | ORAL_TABLET | Freq: Once | ORAL | Status: AC
  Administered 2018-03-04: 10 mg via ORAL
  Filled 2018-03-04: qty 1

## 2018-03-04 MED ORDER — BUTALBITAL-APAP-CAFFEINE 50-325-40 MG PO TABS: 1.0000 | ORAL_TABLET | Freq: Four times a day (QID) | ORAL | 0 refills | Status: DC | PRN

## 2018-03-04 MED ORDER — AMOXICILLIN-POT CLAVULANATE 875-125 MG PO TABS: 1.0000 | ORAL_TABLET | Freq: Two times a day (BID) | ORAL | 0 refills | Status: AC

## 2018-03-04 NOTE — ED Provider Notes (Signed)
[facility] Emergency Department Provider Note ____   First MD Initiated Contact with Patient 03/04/18 1414     (approximate)  I have reviewed the triage vital signs and the nursing notes.   HISTORY  Chief Complaint Headache    HPI Dennis Hayes is a 32 y.o. male presents to the emergency department with historyof intermittent headaches times one month.patient states that he takes Advil for the headaches which resulted in resolution however headache recurs. Patient states that he's noted that the headaches happen whenever he is "stressed". Patient denies any weakness numbness gait instability or visual changes. Patient denies any nausea or vomiting. Patient denies any fever   Past Medical History:  Diagnosis Date  . Asthma     There are no active problems to display for this patient.   History reviewed. No pertinent surgical history.  Prior to Admission medications   Medication Sig Start Date End Date Taking? Authorizing Provider  amoxicillin-clavulanate (AUGMENTIN) 875-125 MG tablet Take 1 tablet by mouth 2 (two) times daily for 10 days. 03/04/18 03/14/18  Darci Current, MD  butalbital-acetaminophen-caffeine (FIORICET, ESGIC) [phone] MG tablet Take 1 tablet by mouth every 6 (six) hours as needed for headache. 03/04/18 03/04/19  Darci Current, MD  naproxen (NAPROSYN) 500 MG tablet Take 1 tablet (500 mg total) by mouth 2 (two) times daily with a meal. 06/01/17   Triplett, Cari B, FNP  omeprazole (PRILOSEC) 20 MG capsule TAKE ONE CAPSULE BY MOUTH EVERY DAY 04/06/16   Almond Lint, MD  ranitidine (ZANTAC) 150 MG tablet Take 1 tablet (150 mg total) by mouth 2 (two) times daily. 05/28/16   Menshew, Charlesetta Ivory, PA-C  sulfamethoxazole-trimethoprim (BACTRIM DS,SEPTRA DS) 800-160 MG tablet Take 1 tablet by mouth 2 (two) times daily. 06/01/17   Triplett, Rulon Eisenmenger B, FNP    Allergies no known drug allergies  Family History  Problem Relation Age of Onset  .  Heart attack Maternal Grandmother   . Hypertension Maternal Grandfather     Social History Social History   Tobacco Use  . Smoking status: Never Smoker  . Smokeless tobacco: Never Used  Substance Use Topics  . Alcohol use: No  . Drug use: No    Review of Systems Constitutional: No fever/chills Eyes: No visual changes. ENT: No sore throat. Cardiovascular: Denies chest pain. Respiratory: Denies shortness of breath. Gastrointestinal: No abdominal pain.  No nausea, no vomiting.  No diarrhea.  No constipation. Genitourinary: Negative for dysuria. Musculoskeletal: Negative for neck pain.  Negative for back pain. Integumentary: Negative for rash. Neurological: positive for headaches, negative forfocal weakness or numbness.   ____________________________________________   PHYSICAL EXAM:  VITAL SIGNS: ED Triage Vitals  Enc Vitals Group     BP 03/04/18 1331 124/70     Pulse Rate 03/04/18 1331 80     Resp --      Temp 03/04/18 1331 98 F (36.7 C)     Temp Source 03/04/18 1331 Oral     SpO2 03/04/18 1331 98 %     Weight 03/04/18 1332 99.8 kg (220 lb)     Height 03/04/18 1332 1.778 m (5\' 10" )     Head Circumference --      Peak Flow --      Pain Score 03/04/18 1335 6     Pain Loc --      Pain Edu? --      Excl. in GC? --     Constitutional: Alert and oriented. Well  appearing and in no acute distress. Eyes: Conjunctivae are normal. PERRL. EOMI. Mouth/Throat: Mucous membranes are moist.  Oropharynx non-erythematous. Neck: No stridor.  No meningeal signs.  Cardiovascular: Normal rate, regular rhythm. Good peripheral circulation. Grossly normal heart sounds. Respiratory: Normal respiratory effort.  No retractions. Lungs CTAB. Gastrointestinal: Soft and nontender. No distention.  Musculoskeletal: No lower extremity tenderness nor edema. No gross deformities of extremities. Neurologic:  Normal speech and language. No gross focal neurologic deficits are appreciated.  Skin:   Skin is warm, dry and intact. No rash noted. Psychiatric: Mood and affect are normal. Speech and behavior are normal.    Procedures   ____________________________________________   INITIAL IMPRESSION / ASSESSMENT AND PLAN / ED COURSE  As part of my medical decision making, I reviewed the following data within the electronic MEDICAL RECORD NUMBER   32 year old male presenting with above stated history of physical exam secondary to headache. Patient with no focal neurological deficits on exam. Suspect tension headaches as etiology for the patient's headache. Also consider possibility of migraine.given absence of any focal neurological deficits imaging of the brain was not performed. Patient will be given Fioricet and advised to follow-up with primary care provider if headache continues.____________________________________________  FINAL CLINICAL IMPRESSION(S) / ED DIAGNOSES  Final diagnoses:  Chronic nonintractable headache, unspecified headache type     MEDICATIONS GIVEN DURING THIS VISIT:  Medications  ketorolac (TORADOL) tablet 10 mg (10 mg Oral Given 03/04/18 1503)     ED Discharge Orders         Ordered    butalbital-acetaminophen-caffeine (FIORICET, ESGIC) 50-325-40 MG tablet  Every 6 hours PRN     03/04/18 1450    amoxicillin-clavulanate (AUGMENTIN) 875-125 MG tablet  2 times daily     03/04/18 1453           Note:  This document was prepared using Dragon voice recognition software and may include unintentional dictation errors.    Darci CurrentBrown, Greenwood N, MD 03/04/18 [phone]

## 2018-03-04 NOTE — ED Notes (Signed)
Pt informed he needed to stay 10 min post med to make sure he tolerated it okay, pt stated he needed to leave and felt he would be fine.

## 2018-03-04 NOTE — ED Triage Notes (Signed)
Pt presents with headache almost constantly for a month. States he has been taking advil for it, which helps, but they keep coming back. Pt alert & oriented with NAD noted.

## 2018-03-04 NOTE — ED Notes (Signed)
See triage note  Presents with headache   States headache is worse when under stress   Also has some pressure across sinus  No fever or n/v

## 2018-04-24 IMAGING — CR DG CHEST 2V
1 series · 2 of 2 positions shown · non-contrast
Comparison: Chest x-ray associated with an abdominal series of
June 30, 2007

CLINICAL DATA: One-day and chest pain, 3 days of headache. History
of asthma, nonsmoker.

EXAM:
CHEST  2 VIEW

[Series 1: dg chest 2 view · 0.14mm/px · 2 of 2 slices shown]
[im 1/2]
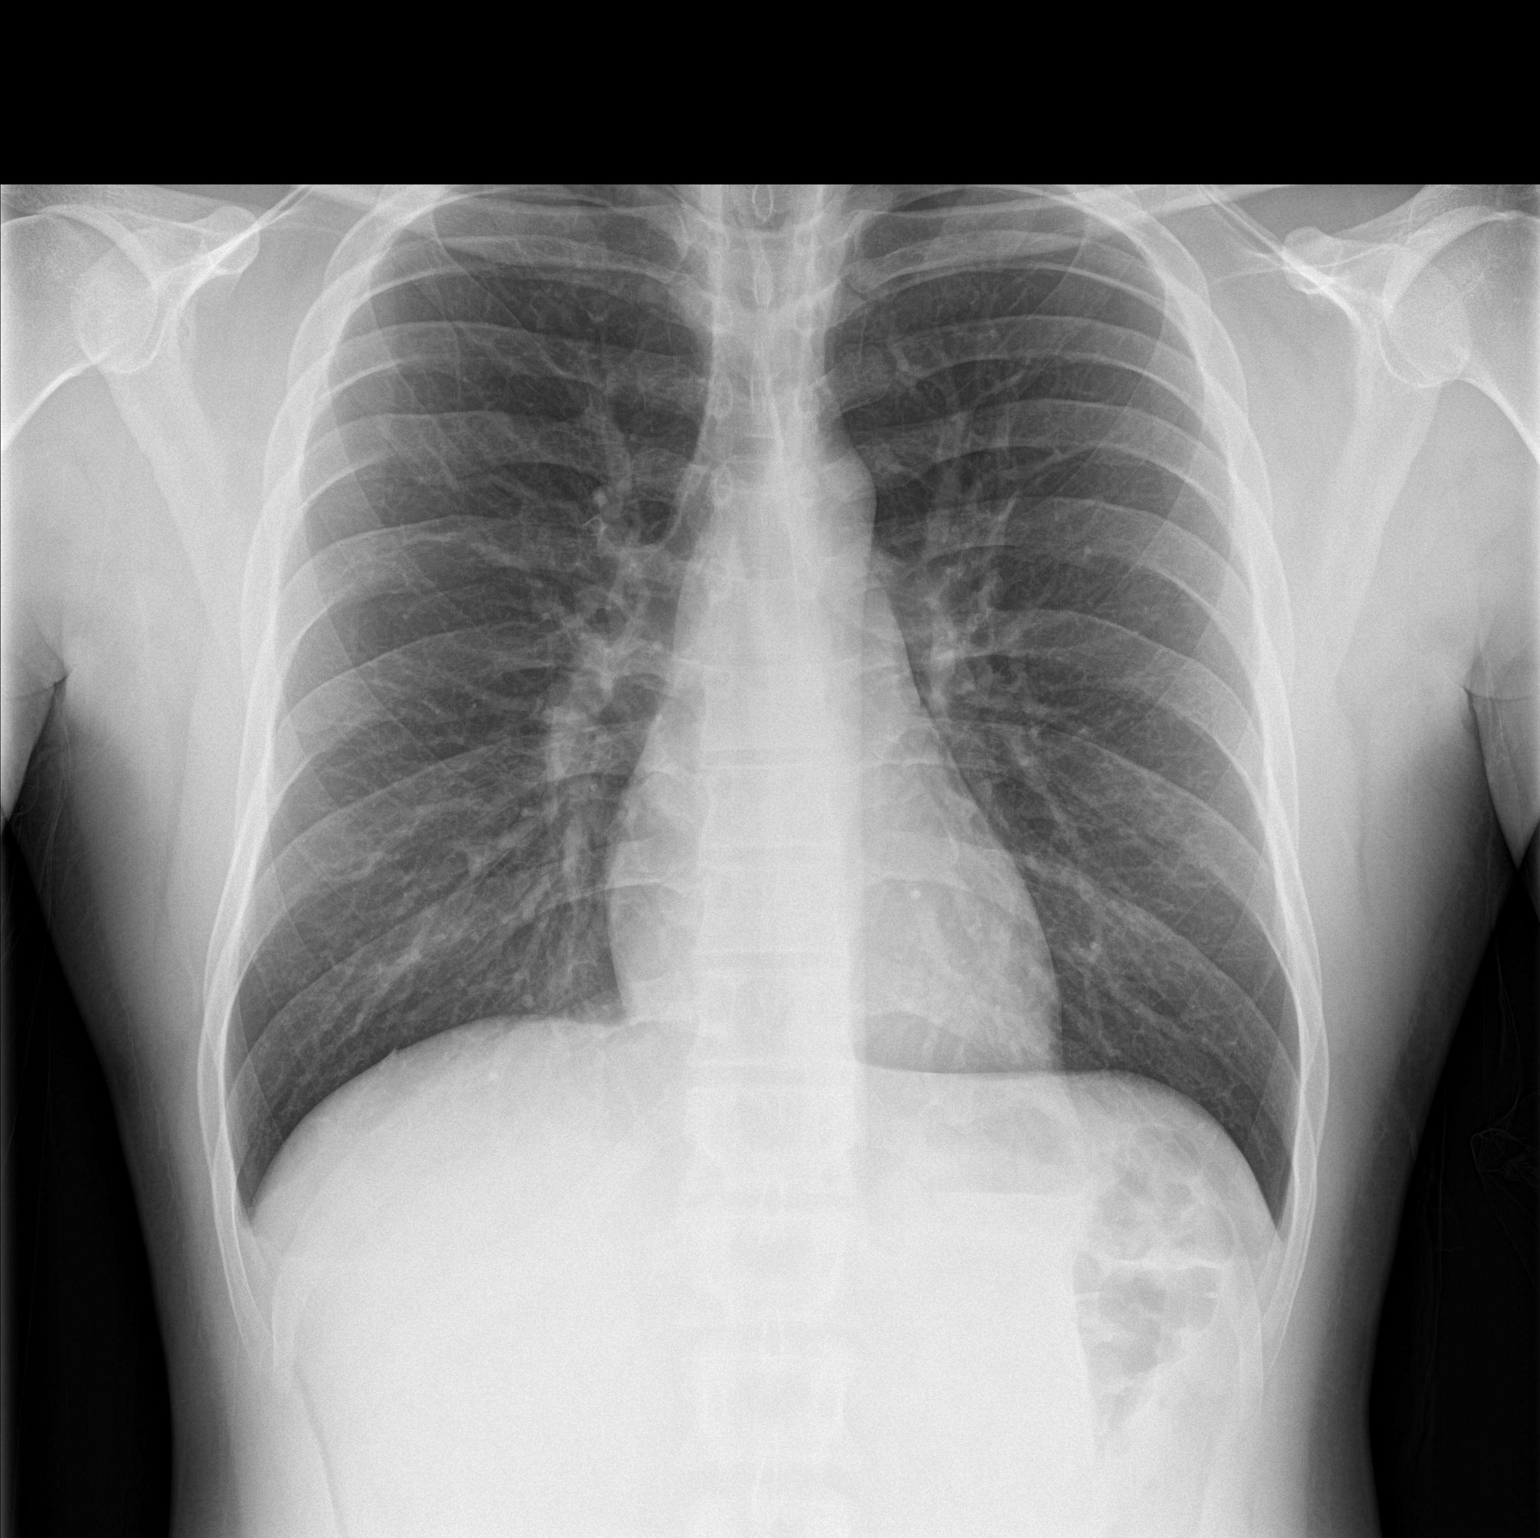
[im 2/2]
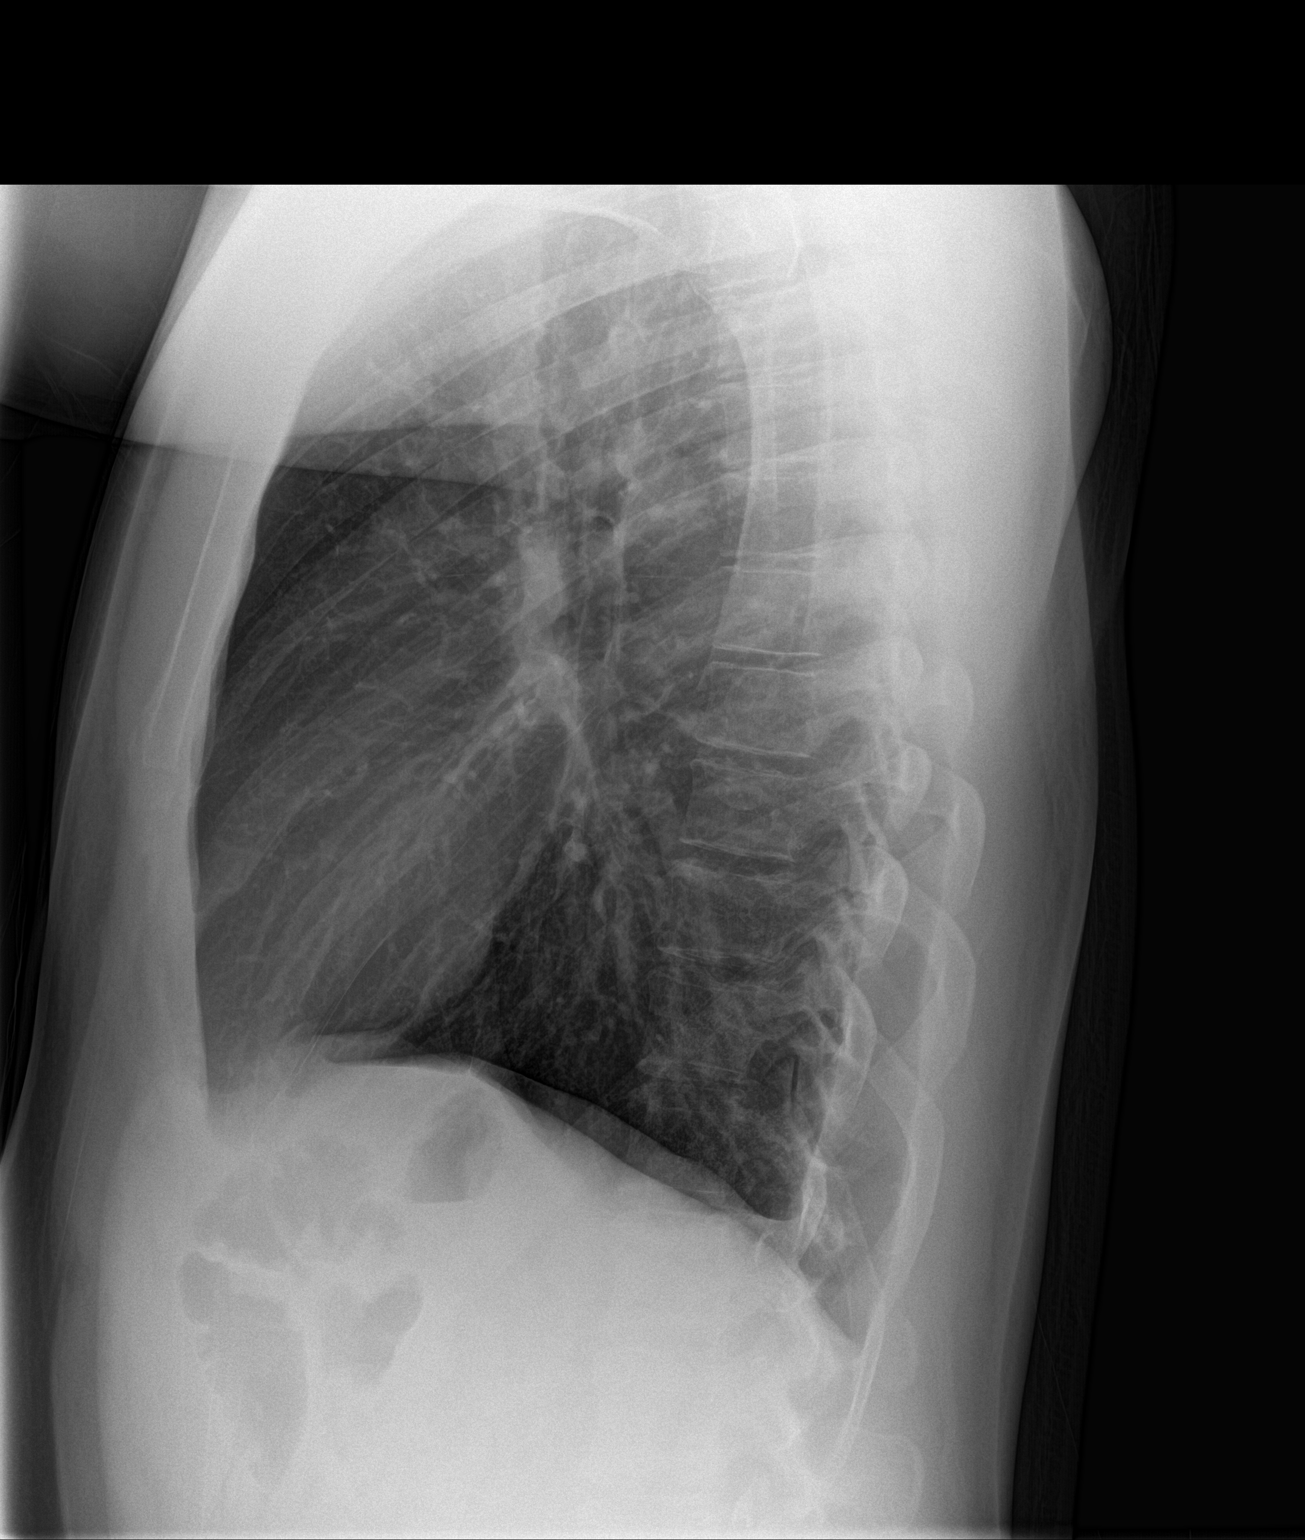

[2 of 2 positions shown; findings below may reference images not displayed]

FINDINGS: The lungs are well-expanded and clear. The heart and pulmonary
vascularity are normal. The mediastinum is normal in width. There is
no pleural effusion, pneumothorax, or pneumomediastinum. The bony
thorax exhibits no acute abnormality.
IMPRESSION: There is no active cardiopulmonary disease.

## 2018-05-05 ENCOUNTER — Emergency Department
Admission: EM | Admit: 2018-05-05 | Discharge: 2018-05-05 | Disposition: A | Discharge status: Home / Self Care | Payer: Self-pay | Attending: Emergency Medicine | Admitting: Emergency Medicine

## 2018-05-05 ENCOUNTER — Emergency Department: Payer: Self-pay

## 2018-05-05 ENCOUNTER — Encounter: Payer: Self-pay | Admitting: Emergency Medicine

## 2018-05-05 DIAGNOSIS — J45909 Unspecified asthma, uncomplicated: Secondary | ICD-10-CM | POA: Insufficient documentation

## 2018-05-05 DIAGNOSIS — K529 Noninfective gastroenteritis and colitis, unspecified: Secondary | ICD-10-CM | POA: Insufficient documentation

## 2018-05-05 DIAGNOSIS — J40 Bronchitis, not specified as acute or chronic: Secondary | ICD-10-CM | POA: Insufficient documentation

## 2018-05-05 DIAGNOSIS — Z79899 Other long term (current) drug therapy: Secondary | ICD-10-CM | POA: Insufficient documentation

## 2018-05-05 LAB — COMPREHENSIVE METABOLIC PANEL
ALT: 26 U/L (ref 0–44)
AST: 22 U/L (ref 15–41)
Albumin: 3.9 g/dL (ref 3.5–5.0)
Alkaline Phosphatase: 51 U/L (ref 38–126)
Anion gap: 5 (ref 5–15)
BUN: 10 mg/dL (ref 6–20)
CHLORIDE: 106 mmol/L (ref 98–111)
CO2: 28 mmol/L (ref 22–32)
Calcium: 8.7 mg/dL — ABNORMAL LOW (ref 8.9–10.3)
Creatinine, Ser: 1.07 mg/dL (ref 0.61–1.24)
GFR calc Af Amer: >60 mL/min (ref >60)
GFR calc non Af Amer: >60 mL/min (ref >60)
Glucose, Bld: 108 mg/dL — ABNORMAL HIGH (ref 70–99)
Potassium: 3.9 mmol/L (ref 3.5–5.1)
SODIUM: 139 mmol/L (ref 135–145)
Total Bilirubin: 0.6 mg/dL (ref 0.3–1.2)
Total Protein: 6.7 g/dL (ref 6.5–8.1)

## 2018-05-05 LAB — CBC
HEMATOCRIT: 48.6 % (ref 39.0–52.0)
Hemoglobin: 16.4 g/dL (ref 13.0–17.0)
MCH: 28.8 pg (ref 26.0–34.0)
MCHC: 33.7 g/dL (ref 30.0–36.0)
MCV: 85.4 fL (ref 80.0–100.0)
NRBC: 0 % (ref 0.0–0.2)
Platelets: 247 10*3/uL (ref 150–400)
RBC: 5.69 MIL/uL (ref 4.22–5.81)
RDW: 12.4 % (ref 11.5–15.5)
WBC: 7.3 10*3/uL (ref 4.0–10.5)

## 2018-05-05 LAB — URINALYSIS, COMPLETE (UACMP) WITH MICROSCOPIC
BILIRUBIN URINE: NEGATIVE
Bacteria, UA: NONE SEEN
Glucose, UA: NEGATIVE mg/dL
Hgb urine dipstick: NEGATIVE
Ketones, ur: NEGATIVE mg/dL
LEUKOCYTES UA: NEGATIVE
Nitrite: NEGATIVE
Protein, ur: NEGATIVE mg/dL
Specific Gravity, Urine: 1.023 (ref 1.005–1.030)
pH: 5 (ref 5.0–8.0)

## 2018-05-05 LAB — LIPASE, BLOOD: LIPASE: 34 U/L (ref 11–51)

## 2018-05-05 MED ORDER — SODIUM CHLORIDE 0.9% FLUSH: 3.0000 mL | Freq: Once | INTRAVENOUS | Status: DC

## 2018-05-05 MED ORDER — GUAIFENESIN-CODEINE 100-10 MG/5ML PO SYRP: 5.0000 mL | ORAL_SOLUTION | Freq: Three times a day (TID) | ORAL | 0 refills | Status: DC | PRN

## 2018-05-05 MED ORDER — PREDNISONE 10 MG PO TABS: 50.0000 mg | ORAL_TABLET | Freq: Every day | ORAL | 0 refills | Status: DC

## 2018-05-05 MED ORDER — ALBUTEROL SULFATE HFA 108 (90 BASE) MCG/ACT IN AERS: 2.0000 | INHALATION_SPRAY | Freq: Four times a day (QID) | RESPIRATORY_TRACT | 2 refills | Status: DC | PRN

## 2018-05-05 NOTE — ED Triage Notes (Signed)
Patient presents to the ED with cough and congestion x 3 weeks and reports nausea and vomiting x 2 days.  Patient denies diarrhea.  Patient states he has been out of his asthma medication since last night and is concerned because he is still coughing so often.  Patient reports occasional dizziness-after coughing fits.  Patient is in no obvious distress at this time.

## 2018-05-05 NOTE — ED Notes (Addendum)
Cough and cold sxs for the past several days along with some vomiting, no vomiting since arrival to ED.  Pt being transported to XR at this time.

## 2018-05-05 NOTE — Discharge Instructions (Signed)
Follow up with the primary care provider of your choice for symptoms that are not improving over the next few days.  Return to the ER for symptoms that change or worsen if unable to schedule an appointment. 

## 2018-05-05 NOTE — ED Provider Notes (Signed)
[facility] Emergency Department Provider Note  ____________________________________________  Time seen: Approximately 10:48 AM  I have reviewed the triage vital signs and the nursing notes.   HISTORY  Chief Complaint Cough; Nasal Congestion; and Emesis   HPI Dennis Hayes is a 33 y.o. male who presents to the emergency department for treatment and evaluation of nausea and vomiting for the past couple of days in addition to 3 weeks of persistent cough.  He has been using his inhaler more often and is now out of the albuterol.  He denies recent fever or sick contacts.    Past Medical History:  Diagnosis Date  . Asthma     There are no active problems to display for this patient.   History reviewed. No pertinent surgical history.  Prior to Admission medications   Medication Sig Start Date End Date Taking? Authorizing Provider  albuterol (PROVENTIL HFA;VENTOLIN HFA) 108 (90 Base) MCG/ACT inhaler Inhale 2 puffs into the lungs every 6 (six) hours as needed for wheezing or shortness of breath. 05/05/18   Nassir Neidert B, FNP  butalbital-acetaminophen-caffeine (FIORICET, ESGIC) 50-325-40 MG tablet Take 1 tablet by mouth every 6 (six) hours as needed for headache. 03/04/18 03/04/19  Darci Current, MD  guaiFENesin-codeine [facility]) 100-10 MG/5ML syrup Take 5 mLs by mouth 3 (three) times daily as needed for cough. 05/05/18   Vestal Markin, Rulon Eisenmenger B, FNP  naproxen (NAPROSYN) 500 MG tablet Take 1 tablet (500 mg total) by mouth 2 (two) times daily with a meal. 06/01/17   Savayah Waltrip B, FNP  omeprazole (PRILOSEC) 20 MG capsule TAKE ONE CAPSULE BY MOUTH EVERY DAY 04/06/16   Almond Lint, MD  predniSONE (DELTASONE) 10 MG tablet Take 5 tablets (50 mg total) by mouth daily. 05/05/18   Rhys Lichty, Rulon Eisenmenger B, FNP  ranitidine (ZANTAC) 150 MG tablet Take 1 tablet (150 mg total) by mouth 2 (two) times daily. 05/28/16   Menshew, Charlesetta Ivory, PA-C  sulfamethoxazole-trimethoprim (BACTRIM  DS,SEPTRA DS) 800-160 MG tablet Take 1 tablet by mouth 2 (two) times daily. 06/01/17   Chinita Pester, FNP    Allergies Patient has no known allergies.  Family History  Problem Relation Age of Onset  . Heart attack Maternal Grandmother   . Hypertension Maternal Grandfather     Social History Social History   Tobacco Use  . Smoking status: Never Smoker  . Smokeless tobacco: Never Used  Substance Use Topics  . Alcohol use: No  . Drug use: No    Review of Systems Constitutional: Negative for fever/chills.  No appetite. ENT: Negative sore throat. Cardiovascular: Denies chest pain. Respiratory: Negative for shortness of breath.  Positive for cough.  Positive for wheezing.  Gastrointestinal: Positive for nausea, positive for vomiting.  No diarrhea.  Musculoskeletal: Negative for body aches Skin: Negative for rash. Neurological: Negative for headaches ____________________________________________   PHYSICAL EXAM:  VITAL SIGNS: ED Triage Vitals  Enc Vitals Group     BP 05/05/18 1000 103/71     Pulse Rate 05/05/18 1000 78     Resp 05/05/18 1000 18     Temp 05/05/18 1000 97.9 F (36.6 C)     Temp Source 05/05/18 1000 Oral     SpO2 05/05/18 1000 99 %     Weight 05/05/18 1002 214 lb (97.1 kg)     Height 05/05/18 1002 5\' 10"  (1.778 m)     Head Circumference --      Peak Flow --  Pain Score 05/05/18 1002 3     Pain Loc --      Pain Edu? --      Excl. in GC? --     Constitutional: Alert and oriented.  Well appearing and in no acute distress. Eyes: Conjunctivae are normal. Ears: Bilateral tympanic membranes appear normal Nose: No sinus congestion noted; no rhinnorhea. Mouth/Throat: Mucous membranes are moist.  Oropharynx normal. Tonsils without exudate. Uvula midline. Neck: No stridor.  Lymphatic: No cervical lymphadenopathy. Cardiovascular: Normal rate, regular rhythm. Good peripheral circulation. Respiratory: Respirations are even and unlabored.  No retractions.   Breath sounds diminished throughout. Gastrointestinal: Soft and nontender.  Musculoskeletal: FROM x 4 extremities.  Neurologic:  Normal speech and language. Skin:  Skin is warm, dry and intact. No rash noted. Psychiatric: Mood and affect are normal. Speech and behavior are normal.  ____________________________________________   LABS (all labs ordered are listed, but only abnormal results are displayed)  Labs Reviewed  COMPREHENSIVE METABOLIC PANEL - Abnormal; Notable for the following components:      Result Value   Glucose, Bld 108 (*)    Calcium 8.7 (*)    All other components within normal limits  URINALYSIS, COMPLETE (UACMP) WITH MICROSCOPIC - Abnormal; Notable for the following components:   Color, Urine YELLOW (*)    APPearance CLEAR (*)    All other components within normal limits  LIPASE, BLOOD  CBC   ____________________________________________  EKG  Not indicated ____________________________________________  RADIOLOGY  Chest x-ray is negative for acute cardiopulmonary abnormality per radiology. ____________________________________________   PROCEDURES  Procedure(s) performed: None  Critical Care performed: No ____________________________________________   INITIAL IMPRESSION / ASSESSMENT AND PLAN / ED COURSE  33 y.o. male presents to the emergency department for treatment and evaluation of 3 weeks of cough and 2 days of nausea and vomiting.  He has not vomited over the past few hours and seems to be feeling better.  He denies diarrhea.  Chest x-ray is reassuring.  He will be treated with steroid, guaifenesin with codeine, and given a refill of his albuterol.  He was encouraged to follow-up with primary care provider of his choice for symptoms are not improving over the next few days.  He was instructed to return to the emergency department for symptoms change or worsen if he is unable to schedule appointment.    Medications - No data to display  ED  Discharge Orders         Ordered    albuterol (PROVENTIL HFA;VENTOLIN HFA) 108 (90 Base) MCG/ACT inhaler  Every 6 hours PRN     05/05/18 1142    guaiFENesin-codeine (ROBITUSSIN AC) 100-10 MG/5ML syrup  3 times daily PRN     05/05/18 1142    predniSONE (DELTASONE) 10 MG tablet  Daily     05/05/18 1142           Pertinent labs & imaging results that were available during my care of the patient were reviewed by me and considered in my medical decision making (see chart for details).    If controlled substance prescribed during this visit, 12 month history viewed on the [facility] prior to issuing an initial prescription for Schedule II or III opiod. ____________________________________________   FINAL CLINICAL IMPRESSION(S) / ED DIAGNOSES  Final diagnoses:  Bronchitis  Gastroenteritis    Note:  This document was prepared using Dragon voice recognition software and may include unintentional dictation errors.     Chinita Pester, FNP 05/05/18 1611  Myrna BlazerSchaevitz, David Matthew, MD 05/05/18 [phone]

## 2018-07-26 ENCOUNTER — Emergency Department
Admission: EM | Admit: 2018-07-26 | Discharge: 2018-07-26 | Disposition: A | Discharge status: Home / Self Care | Payer: Self-pay | Attending: Emergency Medicine | Admitting: Emergency Medicine

## 2018-07-26 ENCOUNTER — Other Ambulatory Visit: Payer: Self-pay

## 2018-07-26 DIAGNOSIS — Z79899 Other long term (current) drug therapy: Secondary | ICD-10-CM | POA: Insufficient documentation

## 2018-07-26 DIAGNOSIS — Z760 Encounter for issue of repeat prescription: Secondary | ICD-10-CM | POA: Insufficient documentation

## 2018-07-26 DIAGNOSIS — Z8709 Personal history of other diseases of the respiratory system: Secondary | ICD-10-CM

## 2018-07-26 DIAGNOSIS — J45909 Unspecified asthma, uncomplicated: Secondary | ICD-10-CM | POA: Insufficient documentation

## 2018-07-26 MED ORDER — ALBUTEROL SULFATE HFA 108 (90 BASE) MCG/ACT IN AERS: 2.0000 | INHALATION_SPRAY | Freq: Four times a day (QID) | RESPIRATORY_TRACT | 0 refills | Status: DC | PRN

## 2018-07-26 NOTE — ED Triage Notes (Signed)
Pt states he needs a refill for his asthma inhalers, pt is in NAD, denies any fever or other sx.

## 2018-07-26 NOTE — Discharge Instructions (Addendum)
Please seek medical attention for any high fevers, chest pain, shortness of breath, change in behavior, persistent vomiting, bloody stool or any other new or concerning symptoms.  

## 2018-07-26 NOTE — ED Notes (Signed)
Pt reports shortness of breath x several months. Pt states that he has hx/o asthma and that he is running out of his inhalers. Pt reports that he is using an inhaler that he got from his uncle but it does not seem to be helping with his symptoms. Pt denies fever, chills, or cough. Pt denies known exposure to COVID-19.

## 2018-07-26 NOTE — ED Provider Notes (Signed)
[facility] Emergency Department Provider Note ____________________________________________   I have reviewed the triage vital signs and the nursing notes.   HISTORY  Chief Complaint Medication refill  History limited by: Not Limited   HPI Dennis Hayes is a 33 y.o. male who presents to the emergency department today because he would like a refill on his albuterol inhaler.  Patient states he has a history of asthma.  He states that it sometimes gets worse when the weather changes.  He has not felt particularly short of breath recently.  Denies any chest pain or fevers.  The patient states that he ran out of his own albuterol inhaler and has been using his uncles.  He states that he typically gets his refills through the emergency department and does not have a primary care physician.  Records reviewed. Per medical record review patient has a history of asthma.   Past Medical History:  Diagnosis Date  . Asthma     There are no active problems to display for this patient.   History reviewed. No pertinent surgical history.  Prior to Admission medications   Medication Sig Start Date End Date Taking? Authorizing Provider  albuterol (PROVENTIL HFA;VENTOLIN HFA) 108 (90 Base) MCG/ACT inhaler Inhale 2 puffs into the lungs every 6 (six) hours as needed for wheezing or shortness of breath. 05/05/18   Triplett, Cari B, FNP  butalbital-acetaminophen-caffeine (FIORICET, ESGIC) 50-325-40 MG tablet Take 1 tablet by mouth every 6 (six) hours as needed for headache. 03/04/18 03/04/19  Darci CurrentBrown, Kenneth N, MD  guaiFENesin-codeine [facility](ROBITUSSIN AC) 100-10 MG/5ML syrup Take 5 mLs by mouth 3 (three) times daily as needed for cough. 05/05/18   Triplett, Rulon Eisenmengerari B, FNP  naproxen (NAPROSYN) 500 MG tablet Take 1 tablet (500 mg total) by mouth 2 (two) times daily with a meal. 06/01/17   Triplett, Cari B, FNP  omeprazole (PRILOSEC) 20 MG capsule TAKE ONE CAPSULE BY MOUTH EVERY DAY 04/06/16   Almond LintIngal,  Aileen, MD  predniSONE (DELTASONE) 10 MG tablet Take 5 tablets (50 mg total) by mouth daily. 05/05/18   Triplett, Rulon Eisenmengerari B, FNP  ranitidine (ZANTAC) 150 MG tablet Take 1 tablet (150 mg total) by mouth 2 (two) times daily. 05/28/16   Menshew, Charlesetta IvoryJenise V Bacon, PA-C  sulfamethoxazole-trimethoprim (BACTRIM DS,SEPTRA DS) 800-160 MG tablet Take 1 tablet by mouth 2 (two) times daily. 06/01/17   Chinita Pesterriplett, Cari B, FNP    Allergies Patient has no known allergies.  Family History  Problem Relation Age of Onset  . Heart attack Maternal Grandmother   . Hypertension Maternal Grandfather     Social History Social History   Tobacco Use  . Smoking status: Never Smoker  . Smokeless tobacco: Never Used  Substance Use Topics  . Alcohol use: No  . Drug use: No    Review of Systems Constitutional: No fever/chills Eyes: No visual changes. ENT: No sore throat. Cardiovascular: Denies chest pain. Respiratory: Positive for intermittent mild shortness of breath.  Gastrointestinal: No abdominal pain.  No nausea, no vomiting.  No diarrhea.   Genitourinary: Negative for dysuria. Musculoskeletal: Negative for back pain. Skin: Negative for rash. Neurological: Negative for headaches, focal weakness or numbness.  ____________________________________________   PHYSICAL EXAM:  VITAL SIGNS: ED Triage Vitals  Enc Vitals Group     BP 07/26/18 0834 112/80     Pulse Rate 07/26/18 0834 79     Resp 07/26/18 0834 18     Temp 07/26/18 0835 97.8 F (36.6 C)  Temp Source 07/26/18 0834 Oral     SpO2 07/26/18 0834 98 %     Weight 07/26/18 0835 228 lb (103.4 kg)     Height 07/26/18 0835 5\' 10"  (1.778 m)     Head Circumference --      Peak Flow --      Pain Score 07/26/18 0835 0   Constitutional: Alert and oriented.  Eyes: Conjunctivae are normal.  ENT      Head: Normocephalic and atraumatic.      Nose: No congestion/rhinnorhea.      Mouth/Throat: Mucous membranes are moist.      Neck: No  stridor. Cardiovascular: Normal rate, regular rhythm.  No murmurs, rubs, or gallops.  Respiratory: Normal respiratory effort without tachypnea nor retractions. Breath sounds are clear and equal bilaterally. No wheezes/rales/rhonchi. Gastrointestinal: Soft and non tender. No rebound. No guarding.  Genitourinary: Deferred Musculoskeletal: Normal range of motion in all extremities.  Neurologic:  Normal speech and language. No gross focal neurologic deficits are appreciated.  Skin:  Skin is warm, dry and intact. No rash noted. Psychiatric: Mood and affect are normal. Speech and behavior are normal. Patient exhibits appropriate insight and judgment.  ____________________________________________    LABS (pertinent positives/negatives)  None  ____________________________________________   EKG  None  ____________________________________________    RADIOLOGY  None  ____________________________________________   PROCEDURES  Procedures  ____________________________________________   INITIAL IMPRESSION / ASSESSMENT AND PLAN / ED COURSE  Pertinent labs & imaging results that were available during my care of the patient were reviewed by me and considered in my medical decision making (see chart for details).   Patient presented to the emergency department today because of desire to get refill on his albuterol inhaler.  He has no particular acute complaints.  Will supply patient with albuterol inhaler.  Discussed with patient portance of establishing care with primary care physician.  ____________________________________________   FINAL CLINICAL IMPRESSION(S) / ED DIAGNOSES  Final diagnoses:  Medication refill  History of asthma     Note: This dictation was prepared with Dragon dictation. Any transcriptional errors that result from this process are unintentional     Phineas Semen, MD 07/26/18 [phone]

## 2018-10-25 ENCOUNTER — Other Ambulatory Visit: Payer: Self-pay

## 2018-10-25 ENCOUNTER — Encounter: Payer: Self-pay | Admitting: Physician Assistant

## 2018-10-25 ENCOUNTER — Ambulatory Visit: Payer: Self-pay | Admitting: Physician Assistant

## 2018-10-25 ENCOUNTER — Ambulatory Visit: Payer: Self-pay

## 2018-10-25 DIAGNOSIS — Z113 Encounter for screening for infections with a predominantly sexual mode of transmission: Secondary | ICD-10-CM

## 2018-10-25 DIAGNOSIS — Z202 Contact with and (suspected) exposure to infections with a predominantly sexual mode of transmission: Secondary | ICD-10-CM

## 2018-10-25 LAB — GRAM STAIN

## 2018-10-25 MED ORDER — METRONIDAZOLE 500 MG PO TABS: 500.0000 mg | ORAL_TABLET | Freq: Once | ORAL | 0 refills | Status: AC

## 2018-10-25 NOTE — Progress Notes (Signed)
Patient here for STD screening, states his girlfriend was diagnosed with Trich on 10/22/2018, but hasn't taken medication yet.Jenetta Downer, RN

## 2018-10-25 NOTE — Progress Notes (Signed)
STI clinic/screening visit  Subjective:  Dennis Hayes is a 33 y.o. male being seen today for an STI screening visit. The patient reports they do not have symptoms.  Patient has the following medical conditions:  There are no active problems to display for this patient.    Chief Complaint  Patient presents with  . SEXUALLY TRANSMITTED DISEASE    HPI  Patient reports that he is not having any symptoms.  States that his partner tested positive for Trich and he would like a screening and treatment as a contact to [place].   See flowsheet for further details and programmatic requirements.    The following portions of the patient's history were reviewed and updated as appropriate: allergies, current medications, past medical history, past social history, past surgical history and problem list.  Objective:  There were no vitals filed for this visit.  Physical Exam Constitutional:      General: He is not in acute distress.    Appearance: Normal appearance.  HENT:     Head: Normocephalic and atraumatic.     Mouth/Throat:     Mouth: Mucous membranes are moist.     Pharynx: Oropharynx is clear. No oropharyngeal exudate or posterior oropharyngeal erythema.  Neck:     Musculoskeletal: Neck supple.  Pulmonary:     Effort: Pulmonary effort is normal.  Abdominal:     Palpations: Abdomen is soft. There is no mass.     Tenderness: There is no abdominal tenderness. There is no guarding or rebound.  Genitourinary:    Penis: Normal.      Scrotum/Testes: Normal.     Comments: Pubic area without nits, lice, edema, erythema, lesions and inguinal adenopathy.  Penis is circumcised and without penile discharge on exam today.  Testes are descended bilaterally and without tenderness , swelling or masses palpated today.  Lymphadenopathy:     Cervical: No cervical adenopathy.  Skin:    General: Skin is warm and dry.     Findings: No bruising, erythema, lesion or rash.  Neurological:   Mental Status: He is alert and oriented to person, place, and time.  Psychiatric:        Mood and Affect: Mood normal.        Behavior: Behavior normal.        Thought Content: Thought content normal.        Judgment: Judgment normal.       Assessment and Plan:  Dennis Hayes is a 33 y.o. male presenting to the [facility] Department for STI screening  1. Screening for STD (sexually transmitted disease) Patient is without symptoms today. Rec condoms with all sex Await test results.  Counseled that RN will call if he needs to RTC for further treatment after results are back.  - Gram stain - HIV [facility] - Syphilis Serology,  Lab - Gonococcus culture - Gonococcus culture - metroNIDAZOLE (FLAGYL) 500 MG tablet; Take 4 tablets at one time with food.  Dispense: 4 tablet; Refill: 0  2. 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  - metroNIDAZOLE (FLAGYL) 500 MG tablet;  Take 4 tablets at one time with food.  Dispense: 4 tablet; Refill: 0  No follow-ups on file.  No future appointments.  Jerene Dilling, PA

## 2018-10-25 NOTE — Progress Notes (Addendum)
Patient treated as contact for Trich per provider orders.Jenetta Downer, RN

## 2018-10-30 LAB — GONOCOCCUS CULTURE

## 2019-01-20 ENCOUNTER — Other Ambulatory Visit: Payer: Self-pay

## 2019-01-20 ENCOUNTER — Encounter: Payer: Self-pay | Admitting: Emergency Medicine

## 2019-01-20 ENCOUNTER — Emergency Department
Admission: EM | Admit: 2019-01-20 | Discharge: 2019-01-20 | Disposition: A | Discharge status: Home / Self Care | Payer: Self-pay | Attending: Emergency Medicine | Admitting: Emergency Medicine

## 2019-01-20 ENCOUNTER — Emergency Department: Payer: Self-pay

## 2019-01-20 DIAGNOSIS — J45909 Unspecified asthma, uncomplicated: Secondary | ICD-10-CM | POA: Insufficient documentation

## 2019-01-20 DIAGNOSIS — Z79899 Other long term (current) drug therapy: Secondary | ICD-10-CM | POA: Insufficient documentation

## 2019-01-20 DIAGNOSIS — R609 Edema, unspecified: Secondary | ICD-10-CM

## 2019-01-20 DIAGNOSIS — M79671 Pain in right foot: Secondary | ICD-10-CM | POA: Insufficient documentation

## 2019-01-20 MED ORDER — IBUPROFEN 600 MG PO TABS: 600.0000 mg | ORAL_TABLET | Freq: Four times a day (QID) | ORAL | 0 refills | Status: DC | PRN

## 2019-01-20 MED ORDER — TRAMADOL HCL 50 MG PO TABS: 50.0000 mg | ORAL_TABLET | Freq: Four times a day (QID) | ORAL | 0 refills | Status: DC | PRN

## 2019-01-20 MED ORDER — KETOROLAC TROMETHAMINE 30 MG/ML IJ SOLN
30.0000 mg | Freq: Once | INTRAMUSCULAR | Status: AC
  Administered 2019-01-20: 30 mg via INTRAMUSCULAR
  Filled 2019-01-20: qty 1

## 2019-01-20 NOTE — Discharge Instructions (Addendum)
There is no fracture on your x-ray.  There is no blood clot on your ultrasound.  Please ice, elevate, wrap foot.  Use crutches.  Take ibuprofen for pain and inflammation.  You can take tramadol for extreme pain.  Follow-up with primary care or podiatry.

## 2019-01-20 NOTE — ED Triage Notes (Addendum)
Presents with right foot pain   States he developed pain after kicking his TV  States this happened on 10/09    Having pain with extension and flexion  No swelling noted   Good pulses

## 2019-01-20 NOTE — ED Provider Notes (Signed)
[facility] Emergency Department Provider Note  ____________________________________________  Time seen: Approximately 11:32 AM  I have reviewed the triage vital signs and the nursing notes.   HISTORY  Chief Complaint Foot Pain    HPI Dennis Hayes is a 33 y.o. male that presents to the emergency department for evaluation of foot pain after injury 10 days ago. Patient kicked a TV 10 days ago and had pain immediately following. Pain is primarily to the outside of his foot and to his heel. No back pain. No IV drug use. No history of gout. No wounds, numbness, tingling.   Past Medical History:  Diagnosis Date  . Asthma     There are no active problems to display for this patient.   History reviewed. No pertinent surgical history.  Prior to Admission medications   Medication Sig Start Date End Date Taking? Authorizing Provider  albuterol (PROVENTIL HFA;VENTOLIN HFA) 108 (90 Base) MCG/ACT inhaler Inhale 2 puffs into the lungs every 6 (six) hours as needed for wheezing or shortness of breath. 05/05/18   Triplett, Rulon Eisenmenger B, FNP  albuterol (VENTOLIN HFA) 108 (90 Base) MCG/ACT inhaler Inhale 2 puffs into the lungs every 6 (six) hours as needed for wheezing or shortness of breath. 07/26/18   Phineas Semen, MD  ibuprofen (ADVIL) 600 MG tablet Take 1 tablet (600 mg total) by mouth every 6 (six) hours as needed. 01/20/19   Enid Derry, PA-C  naproxen (NAPROSYN) 500 MG tablet Take 1 tablet (500 mg total) by mouth 2 (two) times daily with a meal. 06/01/17   Triplett, Cari B, FNP  omeprazole (PRILOSEC) 20 MG capsule TAKE ONE CAPSULE BY MOUTH EVERY DAY 04/06/16   Almond Lint, MD  traMADol (ULTRAM) 50 MG tablet Take 1 tablet (50 mg total) by mouth every 6 (six) hours as needed. 01/20/19 01/20/20  Enid Derry, PA-C    Allergies Patient has no known allergies.  Family History  Problem Relation Age of Onset  . Heart attack Maternal Grandmother   . Hypertension  Maternal Grandfather     Social History Social History   Tobacco Use  . Smoking status: Never Smoker  . Smokeless tobacco: Never Used  Substance Use Topics  . Alcohol use: No  . Drug use: No     Review of Systems  Gastrointestinal: No nausea, no vomiting.  Musculoskeletal: Positive for foot pain.  Skin: Negative for rash, abrasions, lacerations, ecchymosis. Neurological: Negative for numbness or tingling   ____________________________________________   PHYSICAL EXAM:  VITAL SIGNS: ED Triage Vitals  Enc Vitals Group     BP 01/20/19 1057 118/64     Pulse Rate 01/20/19 1057 79     Resp 01/20/19 1057 19     Temp 01/20/19 1057 98.4 F (36.9 C)     Temp Source 01/20/19 1057 Oral     SpO2 01/20/19 1057 99 %     Weight 01/20/19 1042 229 lb (103.9 kg)     Height 01/20/19 1042 5\' 10"  (1.778 m)     Head Circumference --      Peak Flow --      Pain Score 01/20/19 1042 7     Pain Loc --      Pain Edu? --      Excl. in GC? --      Constitutional: Alert and oriented. Well appearing and in no acute distress. Eyes: Conjunctivae are normal. PERRL. EOMI. Head: Atraumatic. ENT:      Ears:  Nose: No congestion/rhinnorhea.      Mouth/Throat: Mucous membranes are moist.  Neck: No stridor.   Cardiovascular: Normal rate, regular rhythm.  Good peripheral circulation. Symmetric pedal pulses.  Respiratory: Normal respiratory effort without tachypnea or retractions. Lungs CTAB. Good air entry to the bases with no decreased or absent breath sounds. Musculoskeletal: Full range of motion to all extremities. No gross deformities appreciated. Tenderness to palpation to heel and lateral foot. Compartments are soft. No visualized erythema. Mild swelling. No ecchymosis.  Neurologic:  Normal speech and language. No gross focal neurologic deficits are appreciated.  Skin:  Skin is warm, dry and intact. No rash noted. Psychiatric: Mood and affect are normal. Speech and behavior are normal.  Patient exhibits appropriate insight and judgement.   ____________________________________________   LABS (all labs ordered are listed, but only abnormal results are displayed)  Labs Reviewed - No data to display ____________________________________________  EKG   ____________________________________________  RADIOLOGY Lexine BatonI, Kenidy Crossland, personally viewed and evaluated these images (plain radiographs) as part of my medical decision making, as well as reviewing the written report by the radiologist  [place] Venous Img Lower Unilateral Right  Result Date: 01/20/2019 CLINICAL DATA:  Right lower extremity pain and edema for the past 10 days after injury. Evaluate for DVT. EXAM: RIGHT LOWER EXTREMITY VENOUS DOPPLER ULTRASOUND TECHNIQUE: Gray-scale sonography with graded compression, as well as color Doppler and duplex ultrasound were performed to evaluate the lower extremity deep venous systems from the level of the common femoral vein and including the common femoral, femoral, profunda femoral, popliteal and calf veins including the posterior tibial, peroneal and gastrocnemius veins when visible. The superficial great saphenous vein was also interrogated. Spectral Doppler was utilized to evaluate flow at rest and with distal augmentation maneuvers in the common femoral, femoral and popliteal veins. COMPARISON:  None. FINDINGS: Contralateral Common Femoral Vein: Respiratory phasicity is normal and symmetric with the symptomatic side. No evidence of thrombus. Normal compressibility. Common Femoral Vein: No evidence of thrombus. Normal compressibility, respiratory phasicity and response to augmentation. Saphenofemoral Junction: No evidence of thrombus. Normal compressibility and flow on color Doppler imaging. Profunda Femoral Vein: No evidence of thrombus. Normal compressibility and flow on color Doppler imaging. Femoral Vein: No evidence of thrombus. Normal compressibility, respiratory phasicity and  response to augmentation. Popliteal Vein: No evidence of thrombus. Normal compressibility, respiratory phasicity and response to augmentation. Calf Veins: No evidence of thrombus. Normal compressibility and flow on color Doppler imaging. Superficial Great Saphenous Vein: No evidence of thrombus. Normal compressibility. Venous Reflux:  None. Other Findings:  None. IMPRESSION: No evidence of DVT within the right lower extremity. Electronically Signed   By: Simonne ComeJohn  Watts M.D.   On: 01/20/2019 12:41   Dg Foot Complete Right  Result Date: 01/20/2019 CLINICAL DATA:  Presents with right anterior, lateral, and medial foot pain. States he developed pain after kicking his TV. Prev fx of right foot when he was 33 years old EXAM: RIGHT FOOT COMPLETE - 3+ VIEW COMPARISON:  None. FINDINGS: There is no evidence of fracture or dislocation. There is no evidence of arthropathy or other focal bone abnormality. Soft tissues are unremarkable. IMPRESSION: Negative right foot radiographs. Electronically Signed   By: Emmaline KluverNancy  Ballantyne M.D.   On: 01/20/2019 11:52    ____________________________________________    PROCEDURES  Procedure(s) performed:    Procedures    Medications  ketorolac (TORADOL) 30 MG/ML injection 30 mg (30 mg Intramuscular Given 01/20/19 1211)     ____________________________________________   INITIAL IMPRESSION /  ASSESSMENT AND PLAN / ED COURSE  Pertinent labs & imaging results that were available during my care of the patient were reviewed by me and considered in my medical decision making (see chart for details).  Review of the  CSRS was performed in accordance of the [place] prior to dispensing any controlled drugs.     Patient presented to the emergency department for evaluation of foot pain following injury 10 days ago.  Vital signs and exam are reassuring.  Foot x-ray negative for acute bony abnormalities.  Ultrasound negative for DVT.  Patient will ice, elevate, limit  weightbearing to foot, which she had not tried previously.  Crutches were given.  Ace wrap was applied.  Patient will be discharged home with prescriptions for Motrin and a short course of tramadol. Patient is to follow up with podiatry as directed. Patient is given ED precautions to return to the ED for any worsening or new symptoms.  Dennis Hayes was evaluated in Emergency Department on 01/20/2019 for the symptoms described in the history of present illness. He was evaluated in the context of the global COVID-19 pandemic, which necessitated consideration that the patient might be at risk for infection with the SARS-CoV-2 virus that causes COVID-19. Institutional protocols and algorithms that pertain to the evaluation of patients at risk for COVID-19 are in a state of rapid change based on information released by regulatory bodies including the CDC and federal and state organizations. These policies and algorithms were followed during the patient's care in the ED.   ____________________________________________  FINAL CLINICAL IMPRESSION(S) / ED DIAGNOSES  Final diagnoses:  Right foot pain      NEW MEDICATIONS STARTED DURING THIS VISIT:  ED Discharge Orders         Ordered    ibuprofen (ADVIL) 600 MG tablet  Every 6 hours PRN     01/20/19 1334    traMADol (ULTRAM) 50 MG tablet  Every 6 hours PRN     01/20/19 1334              This chart was dictated using voice recognition software/Dragon. Despite best efforts to proofread, errors can occur which can change the meaning. Any change was purely unintentional.    Laban Emperor, PA-C 01/20/19 1527    Earleen Newport, MD 01/20/19 1606

## 2019-03-30 ENCOUNTER — Emergency Department
Admission: EM | Admit: 2019-03-30 | Discharge: 2019-03-30 | Disposition: A | Discharge status: Home / Self Care | Payer: Self-pay | Attending: Emergency Medicine | Admitting: Emergency Medicine

## 2019-03-30 ENCOUNTER — Other Ambulatory Visit: Payer: Self-pay

## 2019-03-30 ENCOUNTER — Encounter: Payer: Self-pay | Admitting: Emergency Medicine

## 2019-03-30 DIAGNOSIS — Z79899 Other long term (current) drug therapy: Secondary | ICD-10-CM | POA: Insufficient documentation

## 2019-03-30 DIAGNOSIS — J020 Streptococcal pharyngitis: Secondary | ICD-10-CM | POA: Insufficient documentation

## 2019-03-30 DIAGNOSIS — J45909 Unspecified asthma, uncomplicated: Secondary | ICD-10-CM | POA: Insufficient documentation

## 2019-03-30 LAB — GROUP A STREP BY PCR: Group A Strep by PCR: DETECTED — AB

## 2019-03-30 MED ORDER — LIDOCAINE VISCOUS HCL 2 % MT SOLN
15.0000 mL | Freq: Once | OROMUCOSAL | Status: AC
  Administered 2019-03-30: 15 mL via OROMUCOSAL
  Filled 2019-03-30: qty 15

## 2019-03-30 MED ORDER — ACETAMINOPHEN 500 MG PO TABS: 500.0000 mg | ORAL_TABLET | Freq: Four times a day (QID) | ORAL | 0 refills | Status: DC | PRN

## 2019-03-30 MED ORDER — AMOXICILLIN 500 MG PO CAPS
500.0000 mg | ORAL_CAPSULE | Freq: Once | ORAL | Status: AC
  Administered 2019-03-30: 500 mg via ORAL
  Filled 2019-03-30: qty 1

## 2019-03-30 MED ORDER — AMOXICILLIN 500 MG PO CAPS: 500.0000 mg | ORAL_CAPSULE | Freq: Three times a day (TID) | ORAL | 0 refills | Status: DC

## 2019-03-30 MED ORDER — LIDOCAINE VISCOUS HCL 2 % MT SOLN: 10.0000 mL | OROMUCOSAL | 0 refills | Status: DC | PRN

## 2019-03-30 NOTE — ED Notes (Signed)
Pt presents to the ED for a sore throat. Pt states it is painful to swallow and this has been going on for about 3 days. Pt denies fever and has no other complaints. Pt has a hx of tonsillitis.

## 2019-03-30 NOTE — ED Triage Notes (Signed)
Sore throat x 3 days.    Denies fever.  Voice clear and strong.  NAD

## 2019-03-30 NOTE — ED Provider Notes (Signed)
[facility] Emergency Department Provider Note  ____________________________________________  Time seen: Approximately 10:28 AM  I have reviewed the triage vital signs and the nursing notes.   HISTORY  Chief Complaint Sore Throat    HPI Dennis Hayes is a 33 y.o. male that presents to the emergency department for evaluation of sore throat for 2 days.  Patient denies any sick contacts.  He has a history of tonsillitis.  No known fevers.  No nasal congestion, cough, shortness of breath, chest pain, vomiting, diarrhea.   Past Medical History:  Diagnosis Date  . Asthma     There are no problems to display for this patient.   History reviewed. No pertinent surgical history.  Prior to Admission medications   Medication Sig Start Date End Date Taking? Authorizing Provider  acetaminophen (TYLENOL) 500 MG tablet Take 1 tablet (500 mg total) by mouth every 6 (six) hours as needed. 03/30/19   Laban Emperor, PA-C  albuterol (PROVENTIL HFA;VENTOLIN HFA) 108 (90 Base) MCG/ACT inhaler Inhale 2 puffs into the lungs every 6 (six) hours as needed for wheezing or shortness of breath. 05/05/18   Triplett, Johnette Abraham B, FNP  albuterol (VENTOLIN HFA) 108 (90 Base) MCG/ACT inhaler Inhale 2 puffs into the lungs every 6 (six) hours as needed for wheezing or shortness of breath. 07/26/18   Nance Pear, MD  amoxicillin (AMOXIL) 500 MG capsule Take 1 capsule (500 mg total) by mouth 3 (three) times daily. 03/30/19   Laban Emperor, PA-C  ibuprofen (ADVIL) 600 MG tablet Take 1 tablet (600 mg total) by mouth every 6 (six) hours as needed. 01/20/19   Laban Emperor, PA-C  lidocaine (XYLOCAINE) 2 % solution Use as directed 10 mLs in the mouth or throat as needed. 03/30/19   Laban Emperor, PA-C  naproxen (NAPROSYN) 500 MG tablet Take 1 tablet (500 mg total) by mouth 2 (two) times daily with a meal. 06/01/17   Triplett, Cari B, FNP  omeprazole (PRILOSEC) 20 MG capsule TAKE ONE CAPSULE BY MOUTH  EVERY DAY 04/06/16   Wende Bushy, MD  traMADol (ULTRAM) 50 MG tablet Take 1 tablet (50 mg total) by mouth every 6 (six) hours as needed. 01/20/19 01/20/20  Laban Emperor, PA-C    Allergies Patient has no known allergies.  Family History  Problem Relation Age of Onset  . Heart attack Maternal Grandmother   . Hypertension Maternal Grandfather     Social History Social History   Tobacco Use  . Smoking status: Never Smoker  . Smokeless tobacco: Never Used  Substance Use Topics  . Alcohol use: No  . Drug use: No     Review of Systems  Constitutional: No fever/chills Eyes: No visual changes. No discharge. ENT: Negative for congestion and rhinorrhea. Positive for sore throat. Cardiovascular: No chest pain. Respiratory: Negative for cough. No SOB. Gastrointestinal: No abdominal pain.  No nausea, no vomiting.  No diarrhea.  No constipation. Musculoskeletal: Negative for musculoskeletal pain. Skin: Negative for rash, abrasions, lacerations, ecchymosis. Neurological: Negative for headaches.   ____________________________________________   PHYSICAL EXAM:  VITAL SIGNS: ED Triage Vitals  Enc Vitals Group     BP 03/30/19 0921 132/74     Pulse Rate 03/30/19 0921 88     Resp 03/30/19 0921 16     Temp 03/30/19 0921 99 F (37.2 C)     Temp Source 03/30/19 0921 Oral     SpO2 03/30/19 0921 96 %     Weight 03/30/19 0917 229 lb 0.9 oz (103.9  kg)     Height --      Head Circumference --      Peak Flow --      Pain Score 03/30/19 0917 8     Pain Loc --      Pain Edu? --      Excl. in GC? --      Constitutional: Alert and oriented. Well appearing and in no acute distress. Eyes: Conjunctivae are normal. PERRL. EOMI. No discharge. Head: Atraumatic. ENT: No frontal and maxillary sinus tenderness.      Ears: Tympanic membranes pearly gray with good landmarks. No discharge.      Nose: Mild congestion/rhinnorhea.      Mouth/Throat: Mucous membranes are moist. Oropharynx  erythematous. Tonsils not enlarged. Exudates bilaterally. Uvula midline. Neck: No stridor.   Hematological/Lymphatic/Immunilogical: No cervical lymphadenopathy. Cardiovascular: Normal rate, regular rhythm.  Good peripheral circulation. Respiratory: Normal respiratory effort without tachypnea or retractions. Lungs CTAB. Good air entry to the bases with no decreased or absent breath sounds. Gastrointestinal: Bowel sounds 4 quadrants. Soft and nontender to palpation. No guarding or rigidity. No palpable masses. No distention. Musculoskeletal: Full range of motion to all extremities. No gross deformities appreciated. Neurologic:  Normal speech and language. No gross focal neurologic deficits are appreciated.  Skin:  Skin is warm, dry and intact. No rash noted. Psychiatric: Mood and affect are normal. Speech and behavior are normal. Patient exhibits appropriate insight and judgement.   ____________________________________________   LABS (all labs ordered are listed, but only abnormal results are displayed)  Labs Reviewed  GROUP A STREP BY PCR - Abnormal; Notable for the following components:      Result Value   Group A Strep by PCR DETECTED (*)    All other components within normal limits   ____________________________________________  EKG   ____________________________________________  RADIOLOGY   No results found.  ____________________________________________    PROCEDURES  Procedure(s) performed:    Procedures    Medications  amoxicillin (AMOXIL) capsule 500 mg (has no administration in time range)  lidocaine (XYLOCAINE) 2 % viscous mouth solution 15 mL (has no administration in time range)     ____________________________________________   INITIAL IMPRESSION / ASSESSMENT AND PLAN / ED COURSE  Pertinent labs & imaging results that were available during my care of the patient were reviewed by me and considered in my medical decision making (see chart for  details).  Review of the [place] CSRS was performed in accordance of the [facility] prior to dispensing any controlled drugs.   Patient's diagnosis is consistent with strep throat. Vital signs and exam are reassuring. Patient appears well and is staying well hydrated. Patient feels comfortable going home. Patient will be discharged home with prescriptions for amoxicillin and viscous lidocaine. Patient is to follow up with PCP as needed or otherwise directed. Patient is given ED precautions to return to the ED for any worsening or new symptoms.   Dennis Hayes was evaluated in Emergency Department on 03/30/2019 for the symptoms described in the history of present illness. He was evaluated in the context of the global COVID-19 pandemic, which necessitated consideration that the patient might be at risk for infection with the SARS-CoV-2 virus that causes COVID-19. Institutional protocols and algorithms that pertain to the evaluation of patients at risk for COVID-19 are in a state of rapid change based on information released by regulatory bodies including the CDC and federal and state organizations. These policies and algorithms were followed during the patient's care in the  ED.  ____________________________________________  FINAL CLINICAL IMPRESSION(S) / ED DIAGNOSES  Final diagnoses:  Strep throat      NEW MEDICATIONS STARTED DURING THIS VISIT:  ED Discharge Orders         Ordered    amoxicillin (AMOXIL) 500 MG capsule  3 times daily     03/30/19 1127    lidocaine (XYLOCAINE) 2 % solution  As needed     03/30/19 1127    acetaminophen (TYLENOL) 500 MG tablet  Every 6 hours PRN     03/30/19 1127              This chart was dictated using voice recognition software/Dragon. Despite best efforts to proofread, errors can occur which can change the meaning. Any change was purely unintentional.    Enid DerryWagner, Sosie Gato, PA-C 03/30/19 1317    Shaune PollackIsaacs, [place], MD 04/01/19 1038

## 2019-04-22 ENCOUNTER — Encounter: Payer: Self-pay | Admitting: Emergency Medicine

## 2019-04-22 ENCOUNTER — Emergency Department
Admission: EM | Admit: 2019-04-22 | Discharge: 2019-04-22 | Disposition: A | Discharge status: Home / Self Care | Payer: Self-pay | Attending: Emergency Medicine | Admitting: Emergency Medicine

## 2019-04-22 ENCOUNTER — Other Ambulatory Visit: Payer: Self-pay

## 2019-04-22 ENCOUNTER — Emergency Department: Payer: Self-pay

## 2019-04-22 DIAGNOSIS — R0602 Shortness of breath: Secondary | ICD-10-CM | POA: Insufficient documentation

## 2019-04-22 DIAGNOSIS — J45909 Unspecified asthma, uncomplicated: Secondary | ICD-10-CM | POA: Insufficient documentation

## 2019-04-22 DIAGNOSIS — R42 Dizziness and giddiness: Secondary | ICD-10-CM | POA: Insufficient documentation

## 2019-04-22 DIAGNOSIS — Z20822 Contact with and (suspected) exposure to covid-19: Secondary | ICD-10-CM | POA: Insufficient documentation

## 2019-04-22 LAB — CBC WITH DIFFERENTIAL/PLATELET
Abs Immature Granulocytes: 0.02 10*3/uL (ref 0.00–0.07)
Basophils Absolute: 0.1 10*3/uL (ref 0.0–0.1)
Basophils Relative: 1 %
Eosinophils Absolute: 0.2 10*3/uL (ref 0.0–0.5)
Eosinophils Relative: 3 %
HCT: 44.5 % (ref 39.0–52.0)
Hemoglobin: 14.9 g/dL (ref 13.0–17.0)
Immature Granulocytes: 0 %
Lymphocytes Relative: 30 %
Lymphs Abs: 2.3 10*3/uL (ref 0.7–4.0)
MCH: 28.5 pg (ref 26.0–34.0)
MCHC: 33.5 g/dL (ref 30.0–36.0)
MCV: 85.1 fL (ref 80.0–100.0)
Monocytes Absolute: 0.6 10*3/uL (ref 0.1–1.0)
Monocytes Relative: 8 %
Neutro Abs: 4.5 10*3/uL (ref 1.7–7.7)
Neutrophils Relative %: 58 %
Platelets: 310 10*3/uL (ref 150–400)
RBC: 5.23 MIL/uL (ref 4.22–5.81)
RDW: 12.4 % (ref 11.5–15.5)
WBC: 7.6 10*3/uL (ref 4.0–10.5)
nRBC: 0 % (ref 0.0–0.2)

## 2019-04-22 LAB — URINALYSIS, ROUTINE W REFLEX MICROSCOPIC
Bilirubin Urine: NEGATIVE
Glucose, UA: NEGATIVE mg/dL
Hgb urine dipstick: NEGATIVE
Ketones, ur: NEGATIVE mg/dL
Leukocytes,Ua: NEGATIVE
Nitrite: NEGATIVE
Protein, ur: NEGATIVE mg/dL
Specific Gravity, Urine: 1.013 (ref 1.005–1.030)
pH: 7 (ref 5.0–8.0)

## 2019-04-22 LAB — COMPREHENSIVE METABOLIC PANEL
ALT: 16 U/L (ref 0–44)
AST: 20 U/L (ref 15–41)
Albumin: 4.1 g/dL (ref 3.5–5.0)
Alkaline Phosphatase: 55 U/L (ref 38–126)
Anion gap: 7 (ref 5–15)
BUN: 11 mg/dL (ref 6–20)
CO2: 25 mmol/L (ref 22–32)
Calcium: 9.2 mg/dL (ref 8.9–10.3)
Chloride: 106 mmol/L (ref 98–111)
Creatinine, Ser: 0.74 mg/dL (ref 0.61–1.24)
GFR calc Af Amer: >60 mL/min (ref >60)
GFR calc non Af Amer: >60 mL/min (ref >60)
Glucose, Bld: 108 mg/dL — ABNORMAL HIGH (ref 70–99)
Potassium: 3.8 mmol/L (ref 3.5–5.1)
Sodium: 138 mmol/L (ref 135–145)
Total Bilirubin: 0.6 mg/dL (ref 0.3–1.2)
Total Protein: 7.2 g/dL (ref 6.5–8.1)

## 2019-04-22 LAB — SARS CORONAVIRUS 2 (TAT 6-24 HRS): SARS Coronavirus 2: NEGATIVE

## 2019-04-22 LAB — MAGNESIUM: Magnesium: 1.9 mg/dL (ref 1.7–2.4)

## 2019-04-22 LAB — TROPONIN I (HIGH SENSITIVITY): Troponin I (High Sensitivity): <2 ng/L (ref <18)

## 2019-04-22 MED ORDER — SODIUM CHLORIDE 0.9 % IV BOLUS
1000.0000 mL | Freq: Once | INTRAVENOUS | Status: AC
  Administered 2019-04-22: 1000 mL via INTRAVENOUS

## 2019-04-22 NOTE — Discharge Instructions (Signed)
Your work-up was reassuring.  You should follow-up with your primary care doctor as needed.  Continue drink plenty of fluid.  Stay quarantine at home until your coronavirus test comes back.  Return to ER if you lose consciousness, worsening shortness of breath or any other concerns

## 2019-04-22 NOTE — ED Notes (Signed)
Pt made aware of need for Urine Specimen; labeled specimen cup left for when patient is able to provide.

## 2019-04-22 NOTE — ED Provider Notes (Signed)
[facility] Emergency Department Provider Note  ____________________________________________   First MD Initiated Contact with Patient 04/22/19 [phone]     (approximate)  I have reviewed the triage vital signs and the nursing notes.   HISTORY  Chief Complaint Dizziness    HPI Dennis Hayes is a 34 y.o. male with asthma who comes in with episode of dizziness.  Patient that he woke up this morning felt his normal self.  He was tolerating eating.  He states that while he was at work he got hot and felt like he was going to pass out and lightheaded.  His symptoms were moderate, constant, better with some water and after sitting down, nothing seems to have brought it on.  He states he had another episode of this last week but was not seen in the ER.  He still feels a little lightheaded right now and had a little bit of shortness of breath but states that he does have asthma.  His shortness of breath has mostly resolved since this episode.  Denies any coughing.  Denies any chest pain.  Never had true LOC and did not hit his head.  Denies any Covid symptoms but had a client that was positive recently does not think he was exposed to him.  He denies any leg swelling, recent travel, recent surgery, history of blood clot.  No family history of someone dying at a young age from an arrhythmia or heart issues.           Past Medical History:  Diagnosis Date  . Asthma     There are no problems to display for this patient.   History reviewed. No pertinent surgical history.  Prior to Admission medications   Medication Sig Start Date End Date Taking? Authorizing Provider  acetaminophen (TYLENOL) 500 MG tablet Take 1 tablet (500 mg total) by mouth every 6 (six) hours as needed. 03/30/19   Enid Derry, PA-C  albuterol (PROVENTIL HFA;VENTOLIN HFA) 108 (90 Base) MCG/ACT inhaler Inhale 2 puffs into the lungs every 6 (six) hours as needed for wheezing or shortness of breath.  05/05/18   Triplett, Rulon Eisenmenger B, FNP  albuterol (VENTOLIN HFA) 108 (90 Base) MCG/ACT inhaler Inhale 2 puffs into the lungs every 6 (six) hours as needed for wheezing or shortness of breath. 07/26/18   Phineas Semen, MD  amoxicillin (AMOXIL) 500 MG capsule Take 1 capsule (500 mg total) by mouth 3 (three) times daily. 03/30/19   Enid Derry, PA-C  ibuprofen (ADVIL) 600 MG tablet Take 1 tablet (600 mg total) by mouth every 6 (six) hours as needed. 01/20/19   Enid Derry, PA-C  lidocaine (XYLOCAINE) 2 % solution Use as directed 10 mLs in the mouth or throat as needed. 03/30/19   Enid Derry, PA-C  naproxen (NAPROSYN) 500 MG tablet Take 1 tablet (500 mg total) by mouth 2 (two) times daily with a meal. 06/01/17   Triplett, Cari B, FNP  omeprazole (PRILOSEC) 20 MG capsule TAKE ONE CAPSULE BY MOUTH EVERY DAY 04/06/16   Almond Lint, MD  traMADol (ULTRAM) 50 MG tablet Take 1 tablet (50 mg total) by mouth every 6 (six) hours as needed. 01/20/19 01/20/20  Enid Derry, PA-C    Allergies Patient has no known allergies.  Family History  Problem Relation Age of Onset  . Heart attack Maternal Grandmother   . Hypertension Maternal Grandfather     Social History Social History   Tobacco Use  . Smoking status: Never Smoker  .  Smokeless tobacco: Never Used  Substance Use Topics  . Alcohol use: No  . Drug use: No      Review of Systems Constitutional: No fever/chills, near syncope Eyes: No visual changes. ENT: No sore throat. Cardiovascular: Denies chest pain. Respiratory: shortness of breath. Gastrointestinal: No abdominal pain.  No nausea, no vomiting.  No diarrhea.  No constipation. Genitourinary: Negative for dysuria. Musculoskeletal: Negative for back pain. Skin: Negative for rash. Neurological: Negative for headaches, focal weakness or numbness. All other ROS negative ____________________________________________   PHYSICAL EXAM:  VITAL SIGNS: ED Triage Vitals  Enc Vitals  Group     BP 04/22/19 0931 (!) 141/78     Pulse Rate 04/22/19 0931 61     Resp 04/22/19 0931 18     Temp 04/22/19 0931 98.3 F (36.8 C)     Temp Source 04/22/19 0931 Oral     SpO2 04/22/19 0931 99 %     Weight 04/22/19 0928 225 lb (102.1 kg)     Height 04/22/19 0928 5\' 10"  (1.778 m)     Head Circumference --      Peak Flow --      Pain Score 04/22/19 0930 0     Pain Loc --      Pain Edu? --      Excl. in GC? --     Constitutional: Alert and oriented. Well appearing and in no acute distress. Eyes: Conjunctivae are normal. EOMI. Head: Atraumatic. Tms clear  Nose: No congestion/rhinnorhea. Mouth/Throat: Mucous membranes are moist.   Neck: No stridor. Trachea Midline. FROM Cardiovascular: Normal rate, regular rhythm. Grossly normal heart sounds.  Good peripheral circulation. Respiratory: Normal respiratory effort.  No retractions. Lungs CTAB. Gastrointestinal: Soft and nontender. No distention. No abdominal bruits.  Musculoskeletal: No lower extremity tenderness nor edema.  No joint effusions. Neurologic:  Normal speech and language. No gross focal neurologic deficits are appreciated.  Skin:  Skin is warm, dry and intact. No rash noted. Psychiatric: Mood and affect are normal. Speech and behavior are normal. GU: Deferred   ____________________________________________   LABS (all labs ordered are listed, but only abnormal results are displayed)  Labs Reviewed  COMPREHENSIVE METABOLIC PANEL - Abnormal; Notable for the following components:      Result Value   Glucose, Bld 108 (*)    All other components within normal limits  URINALYSIS, ROUTINE W REFLEX MICROSCOPIC - Abnormal; Notable for the following components:   Color, Urine YELLOW (*)    APPearance CLEAR (*)    All other components within normal limits  SARS CORONAVIRUS 2 (TAT 6-24 HRS)  CBC WITH DIFFERENTIAL/PLATELET  MAGNESIUM  TROPONIN I (HIGH SENSITIVITY)  TROPONIN I (HIGH SENSITIVITY)    ____________________________________________   ED ECG REPORT I, 04/24/19, the attending physician, personally viewed and interpreted this ECG.  EKG is normal sinus rate of 67, no ST elevation, no T wave inversions, normal intervals ____________________________________________  RADIOLOGY Concha Se, personally viewed and evaluated these images (plain radiographs) as part of my medical decision making, as well as reviewing the written report by the radiologist.  ED MD interpretation: No pneumonia noted.  Official radiology report(s): DG Chest Portable 1 View  Result Date: 04/22/2019 CLINICAL DATA:  Shortness of breath EXAM: PORTABLE CHEST 1 VIEW COMPARISON:  05/05/2018 FINDINGS: Cardiac shadows within normal limits. The lungs are well aerated bilaterally. No focal infiltrate or sizable effusion is seen. No bony abnormality is noted. IMPRESSION: No active disease. Electronically Signed   By:  Inez Catalina M.D.   On: 04/22/2019 09:56    ____________________________________________   PROCEDURES  Procedure(s) performed (including Critical Care):  Procedures   ____________________________________________   INITIAL IMPRESSION / ASSESSMENT AND PLAN / ED COURSE  HESHAM WOMAC was evaluated in Emergency Department on 04/22/2019 for the symptoms described in the history of present illness. He was evaluated in the context of the global COVID-19 pandemic, which necessitated consideration that the patient might be at risk for infection with the SARS-CoV-2 virus that causes COVID-19. Institutional protocols and algorithms that pertain to the evaluation of patients at risk for COVID-19 are in a state of rapid change based on information released by regulatory bodies including the CDC and federal and state organizations. These policies and algorithms were followed during the patient's care in the ED.    Patient presents with near syncope most likely not emergent cause given age and  patient felt lightheaded like he was going to pass out.  No murmur heard on auscultation.  Lungs had no evidence of wheezing.  Will get labs to evaluate for electrolyte abnormalities, AKI, anemia.  Will get chest x-ray given shortness of breath and Covid swab.  Low suspicion for PE at this time given PERC negative.  Labs are reassuring.  Urine is negative.  Trope negative I very low suspicion that this was cardiac in nature given age, no chest pain.  Chest x-ray was negative.  Reevaluated patient after a liter of fluid and patient is feeling much better.  Discussed with patient that his coronavirus swab is still pending that he should bring quarantine until results.  Provided a work note for patient.  Patient feels cold will be discharged home and to follow-up with his primary care doctor.  I discussed the provisional nature of ED diagnosis, the treatment so far, the ongoing plan of care, follow up appointments and return precautions with the patient and any family or support people present. They expressed understanding and agreed with the plan, discharged home.   ________________________________________   FINAL CLINICAL IMPRESSION(S) / ED DIAGNOSES   Final diagnoses:  None      MEDICATIONS GIVEN DURING THIS VISIT:  Medications - No data to display   ED Discharge Orders    None       Note:  This document was prepared using Dragon voice recognition software and may include unintentional dictation errors.   Vanessa Redfield, MD 04/22/19 1123

## 2019-04-22 NOTE — ED Notes (Signed)
Radiology at bedside

## 2019-04-22 NOTE — ED Triage Notes (Signed)
Pt reports he was at work this morning and got hot and started feeling really lightheaded.  Also c/o some SHOB but reports history of asthma. VSS at this time.  Denies pain. No fevers.  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  Ambulatory.  Main complaint is feels dizzy.

## 2019-05-30 ENCOUNTER — Other Ambulatory Visit: Payer: Self-pay

## 2019-05-30 ENCOUNTER — Emergency Department
Admission: EM | Admit: 2019-05-30 | Discharge: 2019-05-30 | Disposition: A | Discharge status: Home / Self Care | Payer: Self-pay | Attending: Emergency Medicine | Admitting: Emergency Medicine

## 2019-05-30 ENCOUNTER — Encounter: Payer: Self-pay | Admitting: Emergency Medicine

## 2019-05-30 ENCOUNTER — Emergency Department: Payer: Self-pay

## 2019-05-30 DIAGNOSIS — R0602 Shortness of breath: Secondary | ICD-10-CM

## 2019-05-30 DIAGNOSIS — Z20822 Contact with and (suspected) exposure to covid-19: Secondary | ICD-10-CM | POA: Insufficient documentation

## 2019-05-30 DIAGNOSIS — J45909 Unspecified asthma, uncomplicated: Secondary | ICD-10-CM | POA: Insufficient documentation

## 2019-05-30 DIAGNOSIS — R0600 Dyspnea, unspecified: Secondary | ICD-10-CM | POA: Insufficient documentation

## 2019-05-30 LAB — POC SARS CORONAVIRUS 2 AG: SARS Coronavirus 2 Ag: NEGATIVE

## 2019-05-30 MED ORDER — PREDNISONE 20 MG PO TABS
60.0000 mg | ORAL_TABLET | Freq: Once | ORAL | Status: AC
  Administered 2019-05-30: 11:00:00 60 mg via ORAL
  Filled 2019-05-30: qty 3

## 2019-05-30 MED ORDER — PREDNISONE 20 MG PO TABS: 40.0000 mg | ORAL_TABLET | Freq: Every day | ORAL | 0 refills | Status: DC

## 2019-05-30 NOTE — ED Provider Notes (Signed)
[facility] Emergency Department Provider Note  Time seen: 11:09 AM  I have reviewed the triage vital signs and the nursing notes.   HISTORY  Chief Complaint Shortness of Breath   HPI Dennis Hayes is a 34 y.o. male with a past medical history of asthma presents to the emergency department for shortness of breath.  According to the patient he was at work when he began feeling short of breath, took his albuterol inhaler but did not note any significant improvement.  Patient states he continues to feel short of breath although denies any wheeze.  Denies any fever.  States he has been experiencing a cough over the past 24 hours.  Denies any chest pain abdominal pain nausea vomiting or diarrhea.   Past Medical History:  Diagnosis Date  . Asthma     There are no problems to display for this patient.   History reviewed. No pertinent surgical history.  Prior to Admission medications   Not on File    Allergies  Allergen Reactions  . Iodinated Diagnostic Agents     Family History  Problem Relation Age of Onset  . Heart attack Maternal Grandmother   . Hypertension Maternal Grandfather     Social History Social History   Tobacco Use  . Smoking status: Never Smoker  . Smokeless tobacco: Never Used  Substance Use Topics  . Alcohol use: No  . Drug use: No    Review of Systems Constitutional: Negative for fever. Cardiovascular: Negative for chest pain. Respiratory: Positive for shortness of breath.  Occasional cough. Gastrointestinal: Negative for abdominal pain Musculoskeletal: Negative for musculoskeletal complaints Neurological: Negative for headache All other ROS negative  ____________________________________________   PHYSICAL EXAM:  VITAL SIGNS: ED Triage Vitals  Enc Vitals Group     BP 05/30/19 1048 123/68     Pulse Rate 05/30/19 1048 66     Resp 05/30/19 1048 16     Temp 05/30/19 1048 (!) 97.4 F (36.3 C)     Temp Source  05/30/19 1048 Oral     SpO2 05/30/19 1048 100 %     Weight 05/30/19 1046 225 lb 1.4 oz (102.1 kg)     Height 05/30/19 1046 5\' 9"  (1.753 m)     Head Circumference --      Peak Flow --      Pain Score 05/30/19 1046 0     Pain Loc --      Pain Edu? --      Excl. in GC? --    Constitutional: Alert and oriented. Well appearing and in no distress. Eyes: Normal exam ENT      Head: Normocephalic and atraumatic.      Mouth/Throat: Mucous membranes are moist. Cardiovascular: Normal rate, regular rhythm.  Respiratory: Normal respiratory effort without tachypnea nor retractions. Breath sounds are clear no wheeze. Gastrointestinal: Soft and nontender. No distention.  Musculoskeletal: Nontender with normal range of motion in all extremities.  Neurologic:  Normal speech and language. No gross focal neurologic deficits  Skin:  Skin is warm, dry and intact.  Psychiatric: Mood and affect are normal.   ____________________________________________    EKG  EKG viewed and interpreted by myself shows a normal sinus rhythm at 68 bpm with a narrow QRS, normal axis, normal intervals, no concerning ST changes.  ____________________________________________    RADIOLOGY  Chest x-ray negative  ____________________________________________   INITIAL IMPRESSION / ASSESSMENT AND PLAN / ED COURSE  Pertinent labs & imaging results that were available  during my care of the patient were reviewed by me and considered in my medical decision making (see chart for details).   Patient presents to the emergency department for shortness of breath, worse with exertion.  Patient has clear lung sounds currently but states he took his albuterol inhaler prior to arrival.  We will obtain a chest x-ray and a rapid Covid swab.  Currently the patient appears well with a heart rate in the 60s, normal vitals including 100% room air saturation with clear lung sounds.  Chest x-ray is negative.  Patient's Covid test is  negative.  Overall the patient appears well denies any shortness of breath currently.  Suspect possible asthma exacerbation resolved after the patient used albuterol.  We will discharge with a short course of prednisone and PCP follow-up.  Discussed return precautions.  Patient agreeable plan of care.  [name] was evaluated in Emergency Department on 05/30/2019 for the symptoms described in the history of present illness. He was evaluated in the context of the global COVID-19 pandemic, which necessitated consideration that the patient might be at risk for infection with the SARS-CoV-2 virus that causes COVID-19. Institutional protocols and algorithms that pertain to the evaluation of patients at risk for COVID-19 are in a state of rapid change based on information released by regulatory bodies including the CDC and federal and state organizations. These policies and algorithms were followed during the patient's care in the ED.  ____________________________________________   FINAL CLINICAL IMPRESSION(S) / ED DIAGNOSES  Dyspnea   Harvest Dark, MD 05/30/19 1202

## 2019-05-30 NOTE — ED Triage Notes (Signed)
C/O SOB intermittently x 2 weeks.  States symptoms worse at night.  C/O hot flashes with deep inspiration.  C/O SOB with activity.    AAOx3.  Skin warm and dry. No SOB/ DOE noted.  NAD

## 2019-06-18 ENCOUNTER — Encounter: Payer: Self-pay | Admitting: Emergency Medicine

## 2019-06-18 ENCOUNTER — Emergency Department
Admission: EM | Admit: 2019-06-18 | Discharge: 2019-06-18 | Disposition: A | Discharge status: Home / Self Care | Payer: Self-pay | Attending: Emergency Medicine | Admitting: Emergency Medicine

## 2019-06-18 ENCOUNTER — Other Ambulatory Visit: Payer: Self-pay

## 2019-06-18 ENCOUNTER — Emergency Department: Payer: Self-pay

## 2019-06-18 DIAGNOSIS — J45909 Unspecified asthma, uncomplicated: Secondary | ICD-10-CM | POA: Insufficient documentation

## 2019-06-18 DIAGNOSIS — Z79899 Other long term (current) drug therapy: Secondary | ICD-10-CM | POA: Insufficient documentation

## 2019-06-18 DIAGNOSIS — R0789 Other chest pain: Secondary | ICD-10-CM

## 2019-06-18 LAB — CBC
HCT: 44.1 % (ref 39.0–52.0)
Hemoglobin: 14.8 g/dL (ref 13.0–17.0)
MCH: 29.1 pg (ref 26.0–34.0)
MCHC: 33.6 g/dL (ref 30.0–36.0)
MCV: 86.6 fL (ref 80.0–100.0)
Platelets: 279 10*3/uL (ref 150–400)
RBC: 5.09 MIL/uL (ref 4.22–5.81)
RDW: 12 % (ref 11.5–15.5)
WBC: 8.9 10*3/uL (ref 4.0–10.5)
nRBC: 0 % (ref 0.0–0.2)

## 2019-06-18 LAB — BASIC METABOLIC PANEL
Anion gap: 9 (ref 5–15)
BUN: 15 mg/dL (ref 6–20)
CO2: 24 mmol/L (ref 22–32)
Calcium: 8.9 mg/dL (ref 8.9–10.3)
Chloride: 107 mmol/L (ref 98–111)
Creatinine, Ser: 0.92 mg/dL (ref 0.61–1.24)
GFR calc Af Amer: >60 mL/min (ref >60)
GFR calc non Af Amer: >60 mL/min (ref >60)
Glucose, Bld: 111 mg/dL — ABNORMAL HIGH (ref 70–99)
Potassium: 3.8 mmol/L (ref 3.5–5.1)
Sodium: 140 mmol/L (ref 135–145)

## 2019-06-18 LAB — TROPONIN I (HIGH SENSITIVITY): Troponin I (High Sensitivity): <2 ng/L (ref <18)

## 2019-06-18 MED ORDER — LIDOCAINE VISCOUS HCL 2 % MT SOLN
15.0000 mL | Freq: Once | OROMUCOSAL | Status: AC
  Administered 2019-06-18: 15 mL via ORAL
  Filled 2019-06-18: qty 15

## 2019-06-18 MED ORDER — KETOROLAC TROMETHAMINE 30 MG/ML IJ SOLN
15.0000 mg | Freq: Once | INTRAMUSCULAR | Status: AC
  Administered 2019-06-18: 15 mg via INTRAVENOUS
  Filled 2019-06-18: qty 1

## 2019-06-18 MED ORDER — ALUM & MAG HYDROXIDE-SIMETH 200-200-20 MG/5ML PO SUSP
15.0000 mL | Freq: Once | ORAL | Status: AC
  Administered 2019-06-18: 15 mL via ORAL
  Filled 2019-06-18: qty 30

## 2019-06-18 NOTE — ED Provider Notes (Signed)
[facility] Emergency Department Provider Note   ____________________________________________   First MD Initiated Contact with Patient 06/18/19 [phone]     (approximate)  I have reviewed the triage vital signs and the nursing notes.   HISTORY  Chief Complaint Chest Pain    HPI Dennis Hayes is a 34 y.o. male with past medical history of asthma who presents to the ED complaining of chest pain.  Patient reports he has had a few days of intermittent pain to his left chest, which he describes as a squeezing that is not exacerbated or alleviated by anything.  He has not had any fevers, cough, or shortness of breath.  He did try some Aleve at home without significant relief.  He denies any abdominal pain, nausea, vomiting, or changes in bowel movements.  He states he had similar pain "a long time ago" that seem to go away on its own.        Past Medical History:  Diagnosis Date  . Asthma     There are no problems to display for this patient.   History reviewed. No pertinent surgical history.  Prior to Admission medications   Medication Sig Start Date End Date Taking? Authorizing Provider  predniSONE (DELTASONE) 20 MG tablet Take 2 tablets (40 mg total) by mouth daily. 05/30/19   Harvest Dark, MD    Allergies Iodinated diagnostic agents  Family History  Problem Relation Age of Onset  . Heart attack Maternal Grandmother   . Hypertension Maternal Grandfather     Social History Social History   Tobacco Use  . Smoking status: Never Smoker  . Smokeless tobacco: Never Used  Substance Use Topics  . Alcohol use: No  . Drug use: No    Review of Systems  Constitutional: No fever/chills Eyes: No visual changes. ENT: No sore throat. Cardiovascular: Positive for chest pain. Respiratory: Denies shortness of breath. Gastrointestinal: No abdominal pain.  No nausea, no vomiting.  No diarrhea.  No constipation. Genitourinary: Negative for  dysuria. Musculoskeletal: Negative for back pain. Skin: Negative for rash. Neurological: Negative for headaches, focal weakness or numbness.  ____________________________________________   PHYSICAL EXAM:  VITAL SIGNS: ED Triage Vitals  Enc Vitals Group     BP 06/18/19 0753 123/75     Pulse Rate 06/18/19 0753 75     Resp 06/18/19 0753 16     Temp 06/18/19 0753 98.4 F (36.9 C)     Temp Source 06/18/19 0753 Oral     SpO2 06/18/19 0753 98 %     Weight 06/18/19 0750 224 lb (101.6 kg)     Height 06/18/19 0750 5\' 10"  (1.778 m)     Head Circumference --      Peak Flow --      Pain Score 06/18/19 0749 7     Pain Loc --      Pain Edu? --      Excl. in [place]? --     Constitutional: Alert and oriented. Eyes: Conjunctivae are normal. Head: Atraumatic. Nose: No congestion/rhinnorhea. Mouth/Throat: Mucous membranes are moist. Neck: Normal ROM Cardiovascular: Normal rate, regular rhythm. Grossly normal heart sounds. Respiratory: Normal respiratory effort.  No retractions. Lungs CTAB. Gastrointestinal: Soft and nontender. No distention. Genitourinary: deferred Musculoskeletal: No lower extremity tenderness nor edema. Neurologic:  Normal speech and language. No gross focal neurologic deficits are appreciated. Skin:  Skin is warm, dry and intact. No rash noted. Psychiatric: Mood and affect are normal. Speech and behavior are normal.  ____________________________________________  LABS (all labs ordered are listed, but only abnormal results are displayed)  Labs Reviewed  BASIC METABOLIC PANEL - Abnormal; Notable for the following components:      Result Value   Glucose, Bld 111 (*)    All other components within normal limits  CBC  TROPONIN I (HIGH SENSITIVITY)   ____________________________________________  EKG  ED ECG REPORT I, Chesley Noon, the attending physician, personally viewed and interpreted this ECG.   Date: 06/18/2019  EKG Time: 7:49  Rate: 73  Rhythm:  normal sinus rhythm  Axis: Normal  Intervals:none  ST&T Change: none   PROCEDURES  Procedure(s) performed (including Critical Care):  Procedures   ____________________________________________   INITIAL IMPRESSION / ASSESSMENT AND PLAN / ED COURSE       34 year old male with history of asthma presents to the ED with intermittent squeezing pain to his left chest for the past 4 to 5 days.  Low suspicion for cardiac etiology as he has no risk factors and symptoms are atypical, EKG without evidence of arrhythmia or ischemia, will plan to screen troponin.  Also check chest x-ray and basic labs, will trial GI cocktail.  Chest x-ray negative for acute process, lab work is unremarkable including troponin.  Patient did not have any improvement in pain following GI cocktail and was given dose of Toradol.  I doubt cardiac etiology of her symptoms given they are atypical, heart score of less than 4.  He is appropriate for discharge home and I have counseled him to use Tylenol or ibuprofen as needed for pain.  He was counseled to follow-up with his PCP and to return to the ED for new or worsening symptoms, patient agrees with plan.      ____________________________________________   FINAL CLINICAL IMPRESSION(S) / ED DIAGNOSES  Final diagnoses:  Atypical chest pain     ED Discharge Orders    None       Note:  This document was prepared using Dragon voice recognition software and may include unintentional dictation errors.   Chesley Noon, MD 06/18/19 [phone]

## 2019-06-18 NOTE — ED Notes (Signed)
Pt taken to xray 

## 2019-06-18 NOTE — ED Notes (Signed)
Pt unhooked to go to restroom 

## 2019-06-18 NOTE — ED Triage Notes (Signed)
Pt in via POV, reports intermittent left side chest pain since Saturday, denies any accompanying symptoms, denies any recent injury.  Ambulatory to triage; NAD noted at this time.

## 2019-07-28 IMAGING — CR DG CHEST 2V
1 series · 2 of 2 positions shown · non-contrast
Comparison: 01/31/2016

CLINICAL DATA: Patient reports chest pain off/on since 9 am today.
Reports pain to left chest into left shoulder

EXAM:
CHEST  2 VIEW

[Series 1: dg chest 2 view · 0.14mm/px · 2 of 2 slices shown]
[im 1/2]
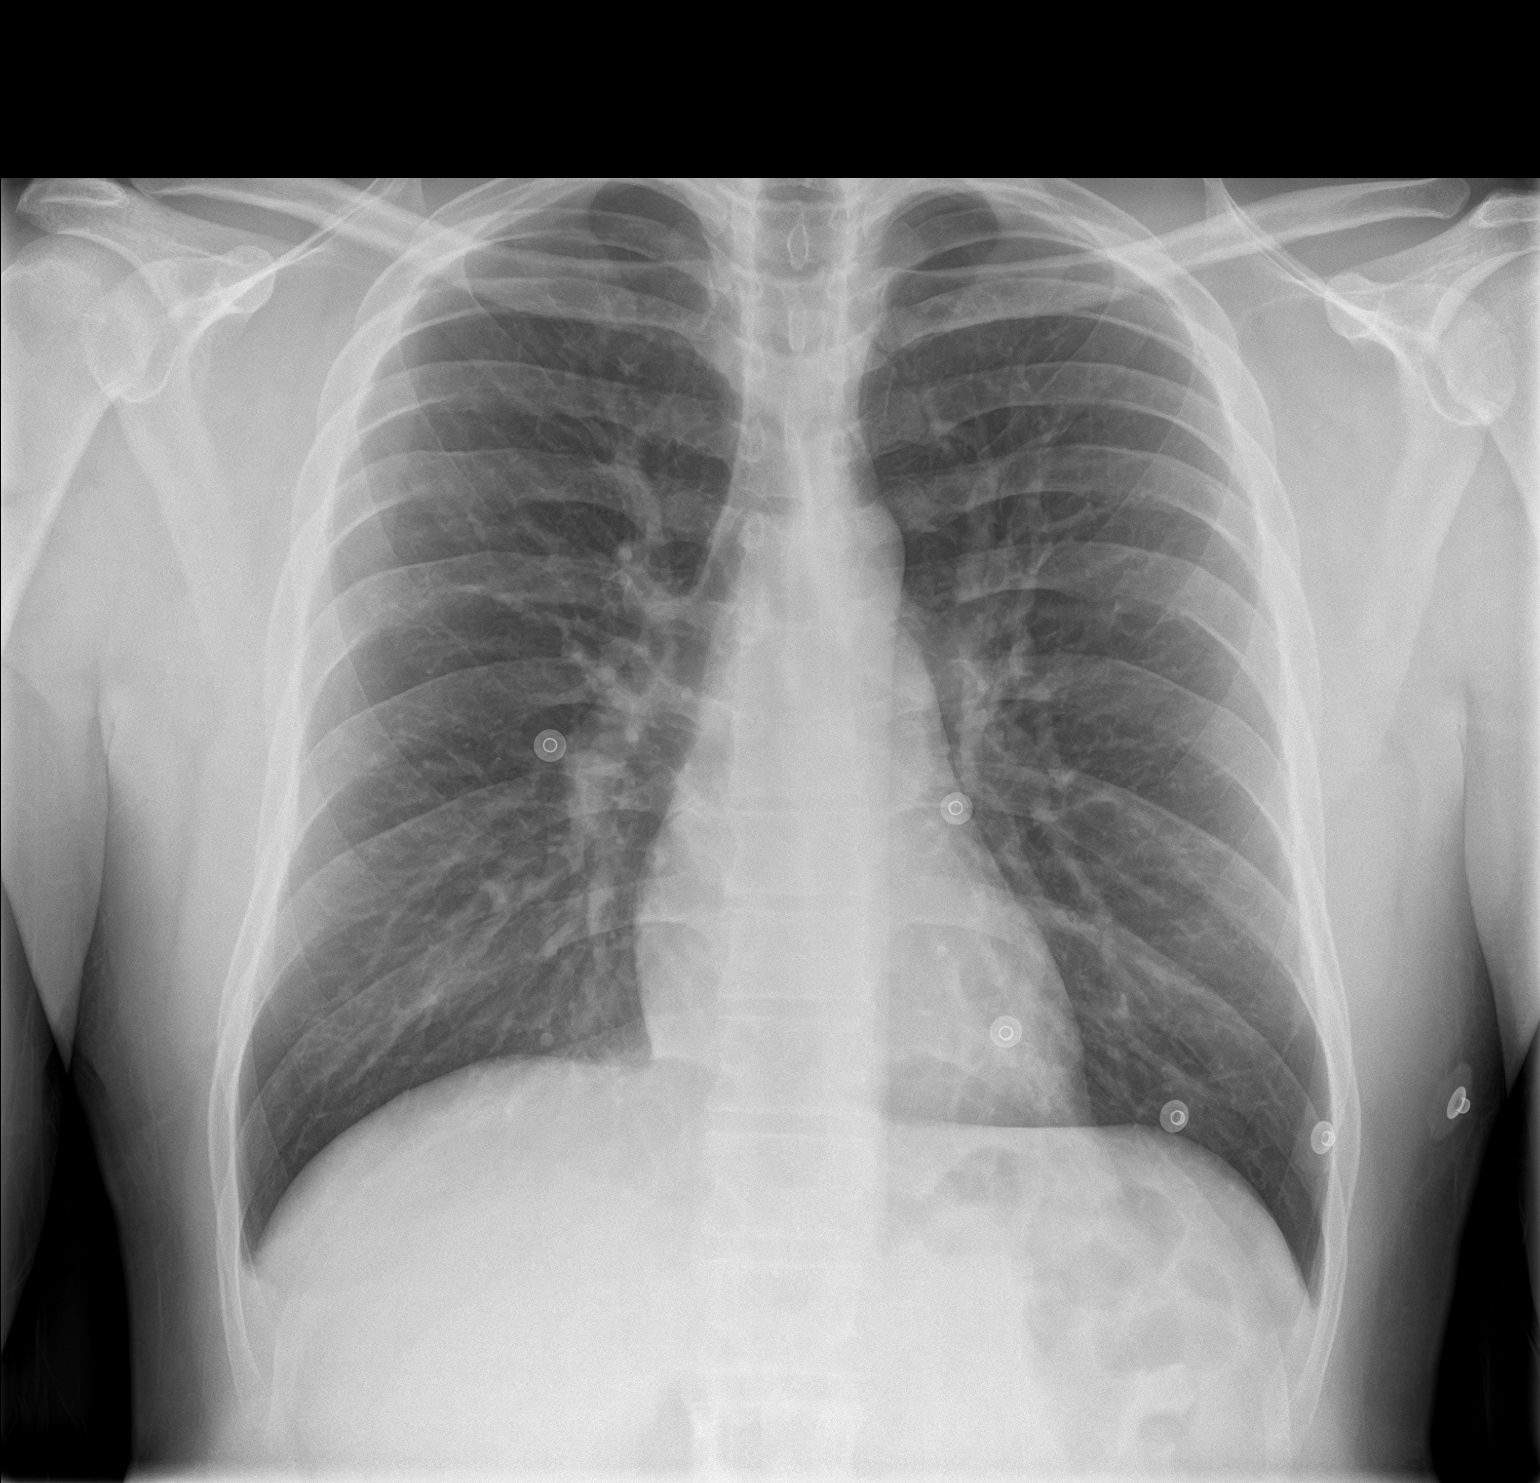
[im 2/2]
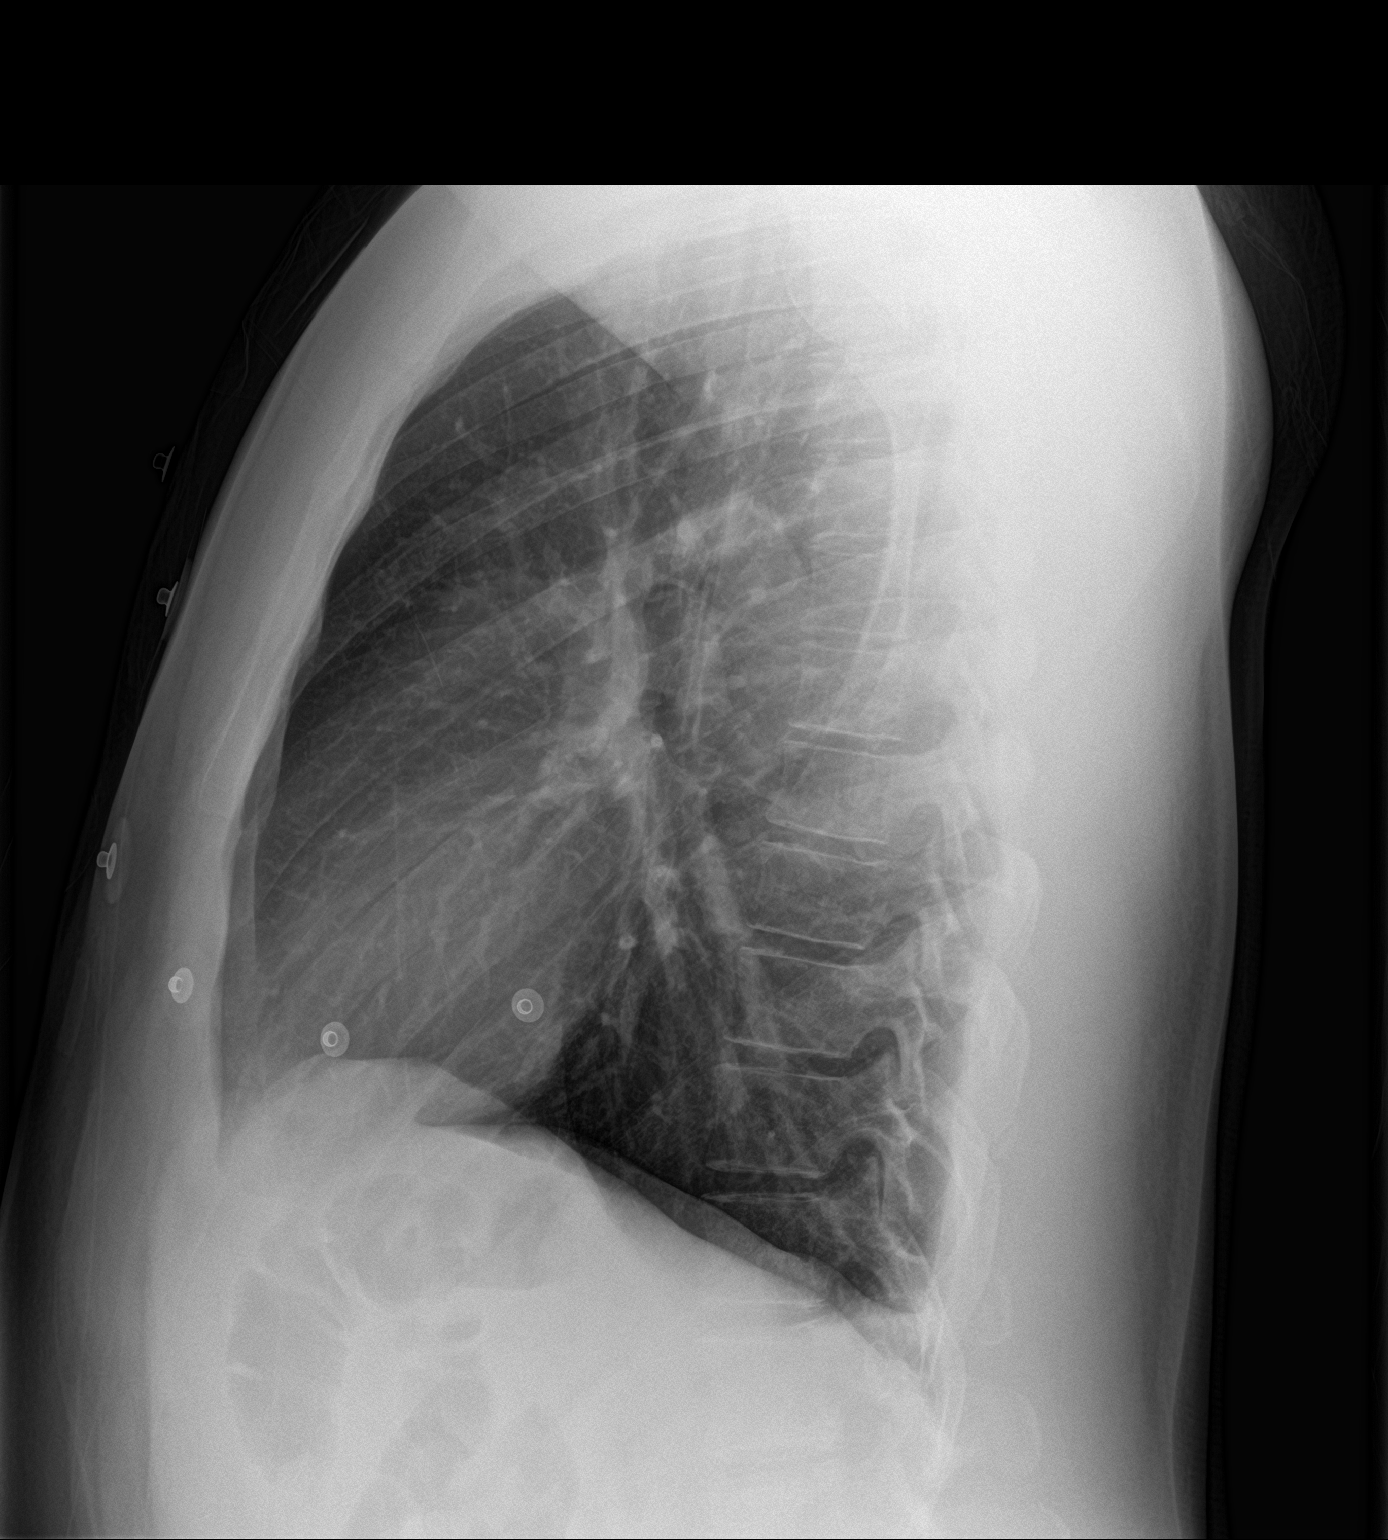

[2 of 2 positions shown; findings below may reference images not displayed]

FINDINGS: The heart size and mediastinal contours are within normal limits.
Both lungs are clear. The visualized skeletal structures are
unremarkable.
IMPRESSION: No active cardiopulmonary disease.

## 2019-12-08 ENCOUNTER — Other Ambulatory Visit: Payer: Self-pay

## 2019-12-08 ENCOUNTER — Telehealth: Payer: Self-pay | Admitting: Physician Assistant

## 2019-12-08 ENCOUNTER — Emergency Department
Admission: EM | Admit: 2019-12-08 | Discharge: 2019-12-08 | Disposition: A | Discharge status: Home / Self Care | Payer: HRSA Program | Attending: Emergency Medicine | Admitting: Emergency Medicine

## 2019-12-08 DIAGNOSIS — J45909 Unspecified asthma, uncomplicated: Secondary | ICD-10-CM | POA: Insufficient documentation

## 2019-12-08 DIAGNOSIS — U071 COVID-19: Secondary | ICD-10-CM | POA: Diagnosis not present

## 2019-12-08 DIAGNOSIS — R05 Cough: Secondary | ICD-10-CM | POA: Diagnosis present

## 2019-12-08 LAB — SARS CORONAVIRUS 2 BY RT PCR (HOSPITAL ORDER, PERFORMED IN ~~LOC~~ HOSPITAL LAB): SARS Coronavirus 2: POSITIVE — AB

## 2019-12-08 NOTE — ED Provider Notes (Signed)
[facility] Emergency Department Provider Note  ____________________________________________   First MD Initiated Contact with Patient 12/08/19 1017     (approximate)  I have reviewed the triage vital signs and the nursing notes.   HISTORY  Chief Complaint URI    HPI Dennis Hayes is a 34 y.o. male presents to the ED with complaint of cough, congestion, fever, chills for the last 4 to 5 days.  Patient is here for Covid test.  Patient does not smoke.  He also did not get the Covid vaccine.       Past Medical History:  Diagnosis Date  . Asthma     There are no problems to display for this patient.   History reviewed. No pertinent surgical history.  Prior to Admission medications   Not on File    Allergies Shellfish allergy and Iodinated diagnostic agents  Family History  Problem Relation Age of Onset  . Heart attack Maternal Grandmother   . Hypertension Maternal Grandfather     Social History Social History   Tobacco Use  . Smoking status: Never Smoker  . Smokeless tobacco: Never Used  Vaping Use  . Vaping Use: Never used  Substance Use Topics  . Alcohol use: No  . Drug use: No    Review of Systems Constitutional: Positive fever/chills Eyes: No visual changes. ENT: No sore throat. Cardiovascular: Denies chest pain. Respiratory: Denies shortness of breath.  Positive for cough. Gastrointestinal: No abdominal pain.  No nausea, no vomiting.  Negative diarrhea.   Genitourinary: Negative for dysuria. Musculoskeletal: Positive for body aches. Skin: Negative for rash. Neurological: Negative for headaches, focal weakness or numbness.   ____________________________________________   PHYSICAL EXAM:  VITAL SIGNS: ED Triage Vitals  Enc Vitals Group     BP 12/08/19 0940 132/82     Pulse Rate 12/08/19 0940 94     Resp 12/08/19 0940 17     Temp 12/08/19 0940 99.1 F (37.3 C)     Temp Source 12/08/19 0940 Oral     SpO2  12/08/19 0940 97 %     Weight 12/08/19 0941 240 lb (108.9 kg)     Height 12/08/19 0941 5\' 10"  (1.778 m)     Head Circumference --      Peak Flow --      Pain Score 12/08/19 0941 0     Pain Loc --      Pain Edu? --      Excl. in GC? --     Constitutional: Alert and oriented. Well appearing and in no acute distress. Eyes: Conjunctivae are normal. PERRL. EOMI. Head: Atraumatic. Nose: No congestion/rhinnorhea. Neck: No stridor.   Cardiovascular: Normal rate, regular rhythm. Grossly normal heart sounds.  Good peripheral circulation. Respiratory: Normal respiratory effort.  No retractions. Lungs CTAB. Musculoskeletal: Moves upper and lower extremities no difficulty and normal gait was noted. Neurologic:  Normal speech and language. No gross focal neurologic deficits are appreciated. No gait instability. Skin:  Skin is warm, dry and intact. No rash noted. Psychiatric: Mood and affect are normal. Speech and behavior are normal.  ____________________________________________   LABS (all labs ordered are listed, but only abnormal results are displayed)  Labs Reviewed  SARS CORONAVIRUS 2 BY RT PCR (HOSPITAL ORDER, PERFORMED IN [facility]) - Abnormal; Notable for the following components:      Result Value   SARS Coronavirus 2 POSITIVE (*)    All other components within normal limits  PROCEDURES  Procedure(s) performed (including Critical Care):  Procedures   ____________________________________________   INITIAL IMPRESSION / ASSESSMENT AND PLAN / ED COURSE  As part of my medical decision making, I reviewed the following data within the electronic MEDICAL RECORD NUMBER Notes from prior ED visits and [place] Controlled Substance Database  Dennis Hayes was evaluated in Emergency Department on 12/08/2019 for the symptoms described in the history of present illness. He was evaluated in the context of the global COVID-19 pandemic, which necessitated consideration that the  patient might be at risk for infection with the SARS-CoV-2 virus that causes COVID-19. Institutional protocols and algorithms that pertain to the evaluation of patients at risk for COVID-19 are in a state of rapid change based on information released by regulatory bodies including the CDC and federal and state organizations. These policies and algorithms were followed during the patient's care in the ED.  34 year old male presents to the ED with complaint of cough, congestion, fever, chills for the last 4 to 5 days.  Patient did not get the Covid vaccine.  Physical exam is unremarkable with the exception of a frequent cough.  Patient Covid test was positive.  Patient was made aware and also given instructions to quarantine.  A note was given to him.  Also information about the infusion team for Covid as he is still within the 10-day window.  Patient is aware that he may get a phone call or he may also use phone calls listed on his discharge papers. ____________________________________________   FINAL CLINICAL IMPRESSION(S) / ED DIAGNOSES  Final diagnoses:  COVID-19 virus infection     ED Discharge Orders    None       Note:  This document was prepared using Dragon voice recognition software and may include unintentional dictation errors.    Tommi Rumps, PA-C 12/08/19 1520    Minna Antis, MD 12/18/19 [phone]

## 2019-12-08 NOTE — Discharge Instructions (Signed)
The phone number for the infusion team was listed on your discharge papers.  You will probably get a phone call from them or you may contact them yourself at the phone number listed on your discharge papers.  Continue to quarantine so that you do not spread the Covid virus.  Tylenol or ibuprofen as needed for fever, headache, body aches.  Increase fluids.

## 2019-12-08 NOTE — ED Notes (Signed)
Date and time results received: 12/08/19 1223  Test: COVID Critical Value: Positive  Name of Provider Notified: Bridget Hartshorn, PA  Orders Received? Or Actions Taken?: Acknowledged

## 2019-12-08 NOTE — ED Notes (Signed)
See triage note  Presents with low grade fever,chills cough and slight body aches  States sxs' started 4 days ago

## 2019-12-08 NOTE — ED Triage Notes (Signed)
Pt c/o cough congestion, fever, chills for the past 4-5 days. Pt is in NAD.

## 2019-12-08 NOTE — Telephone Encounter (Signed)
Called to discuss with Dennis Hayes about Covid symptoms and the use of casirivimab and imdevimab, a monoclonal antibody infusion for those with mild to moderate Covid symptoms and at a high risk of hospitalization.     Pt is qualified for this infusion at the [facility] infusion center due to co-morbid conditions and/or a member of an at-risk group, however declines infusion at this time. Symptoms tier reviewed as well as criteria for ending isolation.  Symptoms reviewed that would warrant ED/Hospital evaluation. Preventative practices reviewed. Patient verbalized understanding. Patient advised to call back if he decides that he does want to get infusion. Callback number to the infusion center given. Patient advised to go to Urgent care or ED with severe symptoms.    Hx of Asthma and BMI of 34.43. Onset of symptoms: 12/02/19.   Manson Passey, [place]

## 2019-12-09 ENCOUNTER — Other Ambulatory Visit: Payer: Self-pay | Admitting: Nurse Practitioner

## 2019-12-09 NOTE — Progress Notes (Signed)
COVID [facility]  Encounter to discuss [facility] infusion treatment with patient based on high risk qualifiers. Chart review reveals patients was contacted by a member of the [facility] team yesterday after provider referral. Patient has declined treatment.  If patient changes his mind, he may contact [phone] and leave a message for provider call back to discuss and schedule treatment.

## 2020-04-06 ENCOUNTER — Emergency Department
Admission: EM | Admit: 2020-04-06 | Discharge: 2020-04-06 | Disposition: A | Discharge status: Home / Self Care | Payer: HRSA Program | Attending: Emergency Medicine | Admitting: Emergency Medicine

## 2020-04-06 ENCOUNTER — Other Ambulatory Visit: Payer: Self-pay

## 2020-04-06 DIAGNOSIS — R5383 Other fatigue: Secondary | ICD-10-CM | POA: Insufficient documentation

## 2020-04-06 DIAGNOSIS — Z20822 Contact with and (suspected) exposure to covid-19: Secondary | ICD-10-CM | POA: Diagnosis not present

## 2020-04-06 DIAGNOSIS — J45909 Unspecified asthma, uncomplicated: Secondary | ICD-10-CM | POA: Insufficient documentation

## 2020-04-06 DIAGNOSIS — M791 Myalgia, unspecified site: Secondary | ICD-10-CM | POA: Diagnosis not present

## 2020-04-06 LAB — SARS CORONAVIRUS 2 (TAT 6-24 HRS): SARS Coronavirus 2: NEGATIVE

## 2020-04-06 NOTE — ED Provider Notes (Signed)
[facility] Emergency Department Provider Note   ____________________________________________    I have reviewed the triage vital signs and the nursing notes.   HISTORY  Chief Complaint Here for Covid test    HPI Dennis Hayes is a 35 y.o. 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  Overall he is feeling well, does report some fatigue and myalgias.  Past Medical History:  Diagnosis Date  . Asthma     There are no problems to display for this patient.   History reviewed. No pertinent surgical history.  Prior to Admission medications   Not on File     Allergies Shellfish allergy and Iodinated diagnostic agents  Family History  Problem Relation Age of Onset  . Heart attack Maternal Grandmother   . Hypertension Maternal Grandfather     Social History Social History   Tobacco Use  . Smoking status: Never Smoker  . Smokeless tobacco: Never Used  Vaping Use  . Vaping Use: Never used  Substance Use Topics  . Alcohol use: No  . Drug use: No    Review of Systems  Constitutional: As above  ENT: No sore throat.   Gastrointestinal: No abdominal pain.  No nausea, no vomiting.   Genitourinary:  Musculoskeletal: Myalgias Skin: Negative for rash. Neurological: Negative for headaches     ____________________________________________   PHYSICAL EXAM:  VITAL SIGNS: ED Triage Vitals  Enc Vitals Group     BP 04/06/20 1223 (!) 145/97     Pulse Rate 04/06/20 1223 90     Resp 04/06/20 1223 18     Temp 04/06/20 1223 97.7 F (36.5 C)     Temp Source 04/06/20 1223 Oral     SpO2 04/06/20 1223 94 %     Weight --      Height --      Head Circumference --      Peak Flow --      Pain Score 04/06/20 1220 3     Pain Loc --      Pain Edu? --      Excl. in GC? --      Constitutional: Alert and oriented. No acute distress. Pleasant and interactive Eyes: Conjunctivae are normal.  Head:  Atraumatic. Nose: No congestion/rhinnorhea. Mouth/Throat: Mucous membranes are moist.   Cardiovascular: Normal rate, regular rhythm.  Respiratory: Normal respiratory effort.  No retractions. Genitourinary: deferred Musculoskeletal: No lower extremity tenderness nor edema.   Neurologic:  Normal speech and language. No gross focal neurologic deficits are appreciated.   Skin:  Skin is warm, dry and intact. No rash noted.   ____________________________________________   LABS (all labs ordered are listed, but only abnormal results are displayed)  Labs Reviewed  SARS CORONAVIRUS 2 (TAT 6-24 HRS)   ____________________________________________  EKG   ____________________________________________  RADIOLOGY   ____________________________________________   PROCEDURES  Procedure(s) performedNo  Procedures   Critical Care performed: No ____________________________________________   INITIAL IMPRESSION / ASSESSMENT AND PLAN / ED COURSE  Pertinent labs & imaging results that were available during my care of the patient were reviewed by me and considered in my medical decision making (see chart for details).  Patient overall quite well-appearing, vital signs reassuring, exam unremarkable.  We will send Covid test as requested.   ____________________________________________   FINAL CLINICAL IMPRESSION(S) / ED DIAGNOSES  Final diagnoses:  Suspected COVID-19 virus infection      NEW MEDICATIONS STARTED DURING THIS VISIT:  There  are no discharge medications for this patient.    Note:  This document was prepared using Dragon voice recognition software and may include unintentional dictation errors.   Jene Every, MD 04/06/20 1348

## 2020-04-06 NOTE — ED Triage Notes (Signed)
Pt here for sore thoat and cough.

## 2020-05-21 ENCOUNTER — Other Ambulatory Visit: Payer: Self-pay

## 2020-05-21 ENCOUNTER — Encounter: Payer: Self-pay | Admitting: Emergency Medicine

## 2020-05-21 ENCOUNTER — Emergency Department
Admission: EM | Admit: 2020-05-21 | Discharge: 2020-05-21 | Disposition: A | Discharge status: Home / Self Care | Payer: HRSA Program | Attending: Emergency Medicine | Admitting: Emergency Medicine

## 2020-05-21 DIAGNOSIS — Z20822 Contact with and (suspected) exposure to covid-19: Secondary | ICD-10-CM | POA: Diagnosis not present

## 2020-05-21 DIAGNOSIS — J45909 Unspecified asthma, uncomplicated: Secondary | ICD-10-CM | POA: Diagnosis not present

## 2020-05-21 DIAGNOSIS — Z8616 Personal history of COVID-19: Secondary | ICD-10-CM | POA: Insufficient documentation

## 2020-05-21 DIAGNOSIS — Z1152 Encounter for screening for COVID-19: Secondary | ICD-10-CM | POA: Insufficient documentation

## 2020-05-21 LAB — SARS CORONAVIRUS 2 (TAT 6-24 HRS): SARS Coronavirus 2: NEGATIVE

## 2020-05-21 NOTE — Discharge Instructions (Addendum)
Results of COVID-19 test will be available in the MyChart app.

## 2020-05-21 NOTE — ED Notes (Signed)
Covid test completed per order. Pt tolerated well.

## 2020-05-21 NOTE — ED Provider Notes (Signed)
[facility] Emergency Department Provider Note   ____________________________________________   Event Date/Time   First MD Initiated Contact with Patient 05/21/20 [id number]     (approximate)  I have reviewed the triage vital signs and the nursing notes.   HISTORY  Chief Complaint COVID test    HPI Dennis Hayes is a 35 y.o. male patient requests Covid test for work. Patient denies symptoms. Patient denies recent travel or known contact with COVID-19. Patient is not taking the COVID-19 vaccine. Patient test positive COVID-19 in September 2021.         Past Medical History:  Diagnosis Date  . Asthma     There are no problems to display for this patient.   History reviewed. No pertinent surgical history.  Prior to Admission medications   Not on File    Allergies Shellfish allergy and Iodinated diagnostic agents  Family History  Problem Relation Age of Onset  . Heart attack Maternal Grandmother   . Hypertension Maternal Grandfather     Social History Social History   Tobacco Use  . Smoking status: Never Smoker  . Smokeless tobacco: Never Used  Vaping Use  . Vaping Use: Never used  Substance Use Topics  . Alcohol use: No  . Drug use: No    Review of Systems Constitutional: No fever/chills Eyes: No visual changes. ENT: No sore throat. Cardiovascular: Denies chest pain. Respiratory: Denies shortness of breath. Gastrointestinal: No abdominal pain.  No nausea, no vomiting.  No diarrhea.  No constipation. Genitourinary: Negative for dysuria. Musculoskeletal: Negative for back pain. Skin: Negative for rash. Neurological: Negative for headaches, focal weakness or numbness. Allergic/Immunilogical: Shellfish and IVP dye.  ____________________________________________   PHYSICAL EXAM:  VITAL SIGNS: ED Triage Vitals  Enc Vitals Group     BP 05/21/20 0914 130/83     Pulse Rate 05/21/20 0914 79     Resp 05/21/20 0914 17     Temp  05/21/20 0914 97.6 F (36.4 C)     Temp Source 05/21/20 0914 Oral     SpO2 05/21/20 0914 98 %     Weight 05/21/20 0912 237 lb (107.5 kg)     Height 05/21/20 0912 5\' 10"  (1.778 m)     Head Circumference --      Peak Flow --      Pain Score 05/21/20 0912 0     Pain Loc --      Pain Edu? --      Excl. in GC? --     Constitutional: Alert and oriented. Well appearing and in no acute distress. Cardiovascular: Normal rate, regular rhythm. Grossly normal heart sounds.  Good peripheral circulation. Respiratory: Normal respiratory effort.  No retractions. Lungs CTAB. Skin:  Skin is warm, dry and intact. No rash noted. Psychiatric: Mood and affect are normal. Speech and behavior are normal.  ____________________________________________   LABS (all labs ordered are listed, but only abnormal results are displayed)  Labs Reviewed  SARS CORONAVIRUS 2 (TAT 6-24 HRS)   ____________________________________________  EKG   ____________________________________________  RADIOLOGY I, 05/23/20, personally viewed and evaluated these images (plain radiographs) as part of my medical decision making, as well as reviewing the written report by the radiologist.  ED MD interpretation:    Official radiology report(s): No results found.  ____________________________________________   PROCEDURES  Procedure(s) performed (including Critical Care):  Procedures   ____________________________________________   INITIAL IMPRESSION / ASSESSMENT AND PLAN / ED COURSE  As part of my medical  decision making, I reviewed the following data within the electronic MEDICAL RECORD NUMBER         Patient presents for COVID-19 test. Patient vies test results will be found later in the MyChart app.      ____________________________________________   FINAL CLINICAL IMPRESSION(S) / ED DIAGNOSES  Final diagnoses:  Encounter for laboratory testing for COVID-19 virus     ED Discharge Orders     None      *Please note:  Dennis Hayes was evaluated in Emergency Department on 05/21/2020 for the symptoms described in the history of present illness. He was evaluated in the context of the global COVID-19 pandemic, which necessitated consideration that the patient might be at risk for infection with the SARS-CoV-2 virus that causes COVID-19. Institutional protocols and algorithms that pertain to the evaluation of patients at risk for COVID-19 are in a state of rapid change based on information released by regulatory bodies including the CDC and federal and state organizations. These policies and algorithms were followed during the patient's care in the ED.  Some ED evaluations and interventions may be delayed as a result of limited staffing during and the pandemic.*   Note:  This document was prepared using Dragon voice recognition software and may include unintentional dictation errors.    Joni Reining, PA-C 05/21/20 4235    Sharman Cheek, MD 05/21/20 1538

## 2020-05-21 NOTE — ED Notes (Signed)
Pt denies any covid symptoms, needing results for a job interview.

## 2020-05-21 NOTE — ED Triage Notes (Signed)
Pt comes into the ED via POV c/o need for a COVID test for work.  Pt denies any symptoms but states his job is requiring it.

## 2020-08-05 ENCOUNTER — Emergency Department
Admission: EM | Admit: 2020-08-05 | Discharge: 2020-08-05 | Disposition: A | Discharge status: Home / Self Care | Payer: Self-pay | Attending: Emergency Medicine | Admitting: Emergency Medicine

## 2020-08-05 ENCOUNTER — Other Ambulatory Visit: Payer: Self-pay

## 2020-08-05 ENCOUNTER — Emergency Department: Payer: Self-pay

## 2020-08-05 DIAGNOSIS — J45909 Unspecified asthma, uncomplicated: Secondary | ICD-10-CM | POA: Insufficient documentation

## 2020-08-05 DIAGNOSIS — R0789 Other chest pain: Secondary | ICD-10-CM | POA: Insufficient documentation

## 2020-08-05 DIAGNOSIS — R0602 Shortness of breath: Secondary | ICD-10-CM | POA: Insufficient documentation

## 2020-08-05 DIAGNOSIS — F331 Major depressive disorder, recurrent, moderate: Secondary | ICD-10-CM

## 2020-08-05 DIAGNOSIS — F410 Panic disorder [episodic paroxysmal anxiety] without agoraphobia: Secondary | ICD-10-CM

## 2020-08-05 DIAGNOSIS — R079 Chest pain, unspecified: Secondary | ICD-10-CM

## 2020-08-05 DIAGNOSIS — F419 Anxiety disorder, unspecified: Secondary | ICD-10-CM | POA: Insufficient documentation

## 2020-08-05 LAB — TROPONIN I (HIGH SENSITIVITY)
Troponin I (High Sensitivity): <2 ng/L (ref <18)
Troponin I (High Sensitivity): <2 ng/L (ref <18)

## 2020-08-05 LAB — CBC
HCT: 42.9 % (ref 39.0–52.0)
Hemoglobin: 14.7 g/dL (ref 13.0–17.0)
MCH: 29 pg (ref 26.0–34.0)
MCHC: 34.3 g/dL (ref 30.0–36.0)
MCV: 84.6 fL (ref 80.0–100.0)
Platelets: 298 10*3/uL (ref 150–400)
RBC: 5.07 MIL/uL (ref 4.22–5.81)
RDW: 12.6 % (ref 11.5–15.5)
WBC: 12.2 10*3/uL — ABNORMAL HIGH (ref 4.0–10.5)
nRBC: 0 % (ref 0.0–0.2)

## 2020-08-05 LAB — D-DIMER, QUANTITATIVE: D-Dimer, Quant: <0.27 ug/mL-FEU (ref 0.00–0.50)

## 2020-08-05 LAB — BASIC METABOLIC PANEL
Anion gap: 7 (ref 5–15)
BUN: 10 mg/dL (ref 6–20)
CO2: 25 mmol/L (ref 22–32)
Calcium: 8.9 mg/dL (ref 8.9–10.3)
Chloride: 107 mmol/L (ref 98–111)
Creatinine, Ser: 0.92 mg/dL (ref 0.61–1.24)
GFR, Estimated: >60 mL/min (ref >60)
Glucose, Bld: 116 mg/dL — ABNORMAL HIGH (ref 70–99)
Potassium: 3.4 mmol/L — ABNORMAL LOW (ref 3.5–5.1)
Sodium: 139 mmol/L (ref 135–145)

## 2020-08-05 MED ORDER — ALUM & MAG HYDROXIDE-SIMETH 200-200-20 MG/5ML PO SUSP
30.0000 mL | Freq: Once | ORAL | Status: AC
  Administered 2020-08-05: 30 mL via ORAL
  Filled 2020-08-05: qty 30

## 2020-08-05 MED ORDER — FLUOXETINE HCL 20 MG PO CAPS: 20.0000 mg | ORAL_CAPSULE | Freq: Every day | ORAL | 1 refills | Status: AC

## 2020-08-05 MED ORDER — LIDOCAINE VISCOUS HCL 2 % MT SOLN
15.0000 mL | Freq: Once | OROMUCOSAL | Status: AC
  Administered 2020-08-05: 15 mL via ORAL
  Filled 2020-08-05: qty 15

## 2020-08-05 MED ORDER — LORAZEPAM 2 MG/ML IJ SOLN
0.5000 mg | Freq: Once | INTRAMUSCULAR | Status: AC
  Administered 2020-08-05: 0.5 mg via INTRAVENOUS
  Filled 2020-08-05: qty 1

## 2020-08-05 NOTE — ED Notes (Signed)
P at bedside.psychiartry at bedside

## 2020-08-05 NOTE — ED Notes (Signed)
Pt calm , collective , ambulatory and understood discharge instructions. Gave pt paper prescription.

## 2020-08-05 NOTE — ED Provider Notes (Signed)
[facility] Emergency Department Provider Note  ____________________________________________   Event Date/Time   First MD Initiated Contact with Patient 08/05/20 1535     (approximate)  I have reviewed the triage vital signs and the nursing notes.   HISTORY  Chief Complaint Chest Pain    HPI Dennis Hayes is a 35 y.o. male presents emergency department complaining of left-sided chest pain.  Patient states that he has had chest pain for several months on and off.  States that it worsened today.  States he attended a funeral for his grandfather yesterday.  He was laying in bed and started having pain and felt like it went to his arm and then became short of breath.  States he got a little dizzy with the shortness of breath.  Patient used to take depression medication and antianxiety medication.  He is not taking medication at this time.  He denies chest pain or shortness of breath at this time.  Patient denies SI at this time, patient states he has had thoughts of this frequently but has not made an actual plan, no HI   Past Medical History:  Diagnosis Date  . Asthma     Patient Active Problem List   Diagnosis Date Noted  . Panic disorder 08/05/2020  . 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 No pertinent surgical history.  Prior to Admission medications   Medication Sig Start Date End Date Taking? Authorizing Provider  FLUoxetine (PROZAC) 20 MG capsule Take 1 capsule (20 mg total) by mouth daily. 08/05/20 08/05/21 Yes Clapacs, Jackquline Denmark, MD    Allergies Shellfish allergy and Iodinated diagnostic agents  Family History  Problem Relation Age of Onset  . Heart attack Maternal Grandmother   . Hypertension Maternal Grandfather     Social History Social History   Tobacco Use  . Smoking status: Never Smoker  . Smokeless tobacco: Never Used  Vaping Use  . Vaping Use: Never used  Substance Use Topics  . Alcohol use:  No  . Drug use: No    Review of Systems  Constitutional: No fever/chills Eyes: No visual changes. ENT: No sore throat. Respiratory: Denies cough Cardiovascular: Positive chest pain Gastrointestinal: Denies abdominal pain Genitourinary: Negative for dysuria. Musculoskeletal: Negative for back pain. Skin: Negative for rash. Psychiatric: Positive mood changes, denies current HI/SI    ____________________________________________   PHYSICAL EXAM:  VITAL SIGNS: ED Triage Vitals  Enc Vitals Group     BP 08/05/20 1440 138/85     Pulse Rate 08/05/20 1440 78     Resp 08/05/20 1440 18     Temp 08/05/20 1440 98.5 F (36.9 C)     Temp Source 08/05/20 1440 Oral     SpO2 08/05/20 1440 99 %     Weight 08/05/20 1438 240 lb (108.9 kg)     Height 08/05/20 1438 5\' 10"  (1.778 m)     Head Circumference --      Peak Flow --      Pain Score 08/05/20 1451 9     Pain Loc --      Pain Edu? --      Excl. in GC? --     Constitutional: Alert and oriented. Well appearing and in no acute distress.  Patient appears a little anxious Eyes: Conjunctivae are normal.  Head: Atraumatic. Nose: No congestion/rhinnorhea. Mouth/Throat: Mucous membranes are moist.   Neck:  supple no lymphadenopathy noted Cardiovascular: Normal rate, regular rhythm. Heart sounds are  normal Respiratory: Normal respiratory effort.  No retractions, lungs c t a  Abd: soft nontender bs normal all 4 quad GU: deferred Musculoskeletal: FROM all extremities, warm and well perfused Neurologic:  Normal speech and language.  Skin:  Skin is warm, dry and intact. No rash noted. Psychiatric: Mood and affect are normal. Speech and behavior are normal.  ____________________________________________   LABS (all labs ordered are listed, but only abnormal results are displayed)  Labs Reviewed  BASIC METABOLIC PANEL - Abnormal; Notable for the following components:      Result Value   Potassium 3.4 (*)    Glucose, Bld 116 (*)     All other components within normal limits  CBC - Abnormal; Notable for the following components:   WBC 12.2 (*)    All other components within normal limits  D-DIMER, QUANTITATIVE  TROPONIN I (HIGH SENSITIVITY)  TROPONIN I (HIGH SENSITIVITY)   ____________________________________________   ____________________________________________  RADIOLOGY  Chest x-ray  ____________________________________________   PROCEDURES  Procedure(s) performed: No  Procedures    ____________________________________________   INITIAL IMPRESSION / ASSESSMENT AND PLAN / ED COURSE  Pertinent labs & imaging results that were available during my care of the patient were reviewed by me and considered in my medical decision making (see chart for details).   Patient is 35 year old male presents with chest pain and anxiety.  See HPI.  Physical exam shows patient appears stable.  DDx: MI, anxiety, esophagitis, PE  Labs are reassuring, CBC has minimal elevation of 12.2, basic metabolic panel has a potassium of 3.4, troponin is normal, D-dimer still pending  Chest x-ray reviewed by me confirmed by radiology to be normal  I think the patient has a low probability of having a PE.  We will do a D-dimer first, if elevated will perform a CT for PE. Marland Kitchen Patient will be given antianxiety medication to see if his symptoms improve, consult to psychiatry   D-dimer and second troponin are both normal  Dr. Toni Amend did speak with the patient.  He states he is okay to go home and was given a prescription for Prozac with follow-up to [facility]  Patient is feeling better after the Ativan and GI cocktail.  He is to follow-up with [facility], his regular physician, or return emergency department worsening.  He was discharged in stable condition.  Dennis Hayes was evaluated in Emergency Department on 08/05/2020 for the symptoms described in the history of present illness. He was evaluated in the context of the global COVID-19  pandemic, which necessitated consideration that the patient might be at risk for infection with the SARS-CoV-2 virus that causes COVID-19. Institutional protocols and algorithms that pertain to the evaluation of patients at risk for COVID-19 are in a state of rapid change based on information released by regulatory bodies including the CDC and federal and state organizations. These policies and algorithms were followed during the patient's care in the ED.    As part of my medical decision making, I reviewed the following data within the electronic MEDICAL RECORD NUMBER Nursing notes reviewed and incorporated, Labs reviewed , EKG interpreted NSR, Old chart reviewed, Radiograph reviewed , A consult was requested and obtained from this/these consultant(s) Psychiatry, Notes from prior ED visits and [place] Controlled Substance Database  ____________________________________________   FINAL CLINICAL IMPRESSION(S) / ED DIAGNOSES  Final diagnoses:  Nonspecific chest pain  Anxiety      NEW MEDICATIONS STARTED DURING THIS VISIT:  Current Discharge Medication List    START taking  these medications   Details  FLUoxetine (PROZAC) 20 MG capsule Take 1 capsule (20 mg total) by mouth daily. Qty: 30 capsule, Refills: 1         Note:  This document was prepared using Dragon voice recognition software and may include unintentional dictation errors.    Faythe Ghee, PA-C 08/05/20 Atha Starks, MD 08/05/20 2330

## 2020-08-05 NOTE — Consult Note (Signed)
[facility] Face-to-Face Psychiatry Consult   Reason for Consult: Consult for this 35 year old man who came to the hospital with some chest pain who is probably having panic attacks Referring Physician: Paduchowski Patient Identification: Dennis Hayes MRN:  [id number] Principal Diagnosis: Panic disorder Diagnosis:  Principal Problem:   Panic disorder Active Problems:   Moderate recurrent major depression (HCC)   Total Time spent with patient: 1 hour  Subjective:   Dennis Hayes is a 35 y.o. male patient admitted with "I was having some chest pain issues".  HPI: Patient seen chart reviewed.  Patient said he came into the hospital because he was having chest pain but it turns out not to be cardiac.  He said that these chest pains will happen almost every day sometimes lasting a couple seconds sometimes many minutes.  Often accompanied by shortness of breath and dizziness and feelings of panic and anxiety.  He also says his baseline mood is down and dysphoric.  Often feels depressed.  Little motivation to do things.  Energy low.  Sleep cycle is chaotic because of work schedule.  Patient says he has had suicidal thoughts but without any intention or plan to act on it.  No psychotic symptoms.  Denies alcohol or drug abuse.  Feels like he has multiple life stresses and is isolated.  Grandfather died yesterday.  Patient said that he had never had treatment before but when we got to talking about medications told me that [facility] had prescribed something for him in the past.  Past Psychiatric History: No prior hospitalizations.  Says when he was much much younger he cut himself on the wrist but has had no suicide attempts since then.  Saw [facility] and was prescribed medication but cannot remember what it was.  No inpatient treatment.  No psychosis or mania.  Risk to Self:   Risk to Others:   Prior Inpatient Therapy:   Prior Outpatient Therapy:    Past Medical History:  Past Medical History:   Diagnosis Date  . Asthma    History reviewed. No pertinent surgical history. Family History:  Family History  Problem Relation Age of Onset  . Heart attack Maternal Grandmother   . Hypertension Maternal Grandfather    Family Psychiatric  History: Denies any Social History:  Social History   Substance and Sexual Activity  Alcohol Use No     Social History   Substance and Sexual Activity  Drug Use No    Social History   Socioeconomic History  . Marital status: Single    Spouse name: Not on file  . Number of children: Not on file  . Years of education: Not on file  . Highest education level: Not on file  Occupational History  . Not on file  Tobacco Use  . Smoking status: Never Smoker  . Smokeless tobacco: Never Used  Vaping Use  . Vaping Use: Never used  Substance and Sexual Activity  . Alcohol use: No  . Drug use: No  . Sexual activity: Yes  Other Topics Concern  . Not on file  Social History Narrative  . Not on file   Social Determinants of Health   Financial Resource Strain: Not on file  Food Insecurity: Not on file  Transportation Needs: Not on file  Physical Activity: Not on file  Stress: Not on file  Social Connections: Not on file   Additional Social History:    Allergies:   Allergies  Allergen Reactions  . Shellfish Allergy  Anaphylaxis  . Iodinated Diagnostic Agents Other (See Comments)    Unknown; allergy to Shellfish    Labs:  Results for orders placed or performed during the hospital encounter of 08/05/20 (from the past 48 hour(s))  Basic metabolic panel     Status: Abnormal   Collection Time: 08/05/20  2:47 PM  Result Value Ref Range   Sodium 139 135 - 145 mmol/L   Potassium 3.4 (L) 3.5 - 5.1 mmol/L   Chloride 107 98 - 111 mmol/L   CO2 25 22 - 32 mmol/L   Glucose, Bld 116 (H) 70 - 99 mg/dL    Comment: Glucose reference range applies only to samples taken after fasting for at least 8 hours.   BUN 10 6 - 20 mg/dL   Creatinine, Ser  1.610.92 0.61 - 1.24 mg/dL   Calcium 8.9 8.9 - 09.610.3 mg/dL   GFR, Estimated >04>60 >54>60 mL/min    Comment: (NOTE) Calculated using the CKD-EPI Creatinine Equation (2021)    Anion gap 7 5 - 15    Comment: Performed at [facility], [address] [place], [place] [address]  CBC     Status: Abnormal   Collection Time: 08/05/20  2:47 PM  Result Value Ref Range   WBC 12.2 (H) 4.0 - 10.5 K/uL   RBC 5.07 4.22 - 5.81 MIL/uL   Hemoglobin 14.7 13.0 - 17.0 g/dL   HCT 91.442.9 78.239.0 - 95.652.0 %   MCV 84.6 80.0 - 100.0 fL   MCH 29.0 26.0 - 34.0 pg   MCHC 34.3 30.0 - 36.0 g/dL   RDW 21.312.6 08.611.5 - 57.815.5 %   Platelets 298 150 - 400 K/uL   nRBC 0.0 0.0 - 0.2 %    Comment: Performed at [facility], [address] [place], [place] [address]  Troponin I (High Sensitivity)     Status: None   Collection Time: 08/05/20  2:47 PM  Result Value Ref Range   Troponin I (High Sensitivity) <2 <18 ng/L    Comment: (NOTE) Elevated high sensitivity troponin I (hsTnI) values and significant  changes across serial measurements may suggest ACS but many other  chronic and acute conditions are known to elevate hsTnI results.  Refer to the "Links" section for chest pain algorithms and additional  guidance. Performed at [facility], [address] [place], [place] [address]   D-dimer, quantitative     Status: None   Collection Time: 08/05/20  3:58 PM  Result Value Ref Range   D-Dimer, Quant <0.27 0.00 - 0.50 ug/mL-FEU    Comment: (NOTE) At the manufacturer cut-off value of 0.5 g/mL FEU, this assay has a negative predictive value of 95-100%.This assay is intended for use in conjunction with a clinical pretest probability (PTP) assessment model to exclude pulmonary embolism (PE) and deep venous thrombosis (DVT) in outpatients suspected of PE or DVT. Results should be correlated with clinical presentation. Performed at [facility], [address] [place], [place] [address]     No  current facility-administered medications for this encounter.   Current Outpatient Medications  Medication Sig Dispense Refill  . FLUoxetine (PROZAC) 20 MG capsule Take 1 capsule (20 mg total) by mouth daily. 30 capsule 1    Musculoskeletal: Strength & Muscle Tone: within normal limits Gait & Station: normal Patient leans: N/A            Psychiatric Specialty Exam:  Presentation  General Appearance: No data recorded Eye Contact:No data recorded Speech:No data recorded  Speech Volume:No data recorded Handedness:No data recorded  Mood and Affect  Mood:No data recorded Affect:No data recorded  Thought Process  Thought Processes:No data recorded Descriptions of Associations:No data recorded Orientation:No data recorded Thought Content:No data recorded History of Schizophrenia/Schizoaffective disorder:No data recorded Duration of Psychotic Symptoms:No data recorded Hallucinations:No data recorded Ideas of Reference:No data recorded Suicidal Thoughts:No data recorded Homicidal Thoughts:No data recorded  Sensorium  Memory:No data recorded Judgment:No data recorded Insight:No data recorded  Executive Functions  Concentration:No data recorded Attention Span:No data recorded Recall:No data recorded Fund of Knowledge:No data recorded Language:No data recorded  Psychomotor Activity  Psychomotor Activity:No data recorded  Assets  Assets:No data recorded  Sleep  Sleep:No data recorded  Physical Exam: Physical Exam Vitals and nursing note reviewed.  Constitutional:      Appearance: Normal appearance.  HENT:     Head: Normocephalic and atraumatic.     Mouth/Throat:     Pharynx: Oropharynx is clear.  Eyes:     Pupils: Pupils are equal, round, and reactive to light.  Cardiovascular:     Rate and Rhythm: Normal rate and regular rhythm.  Pulmonary:     Effort: Pulmonary effort is normal.     Breath sounds: Normal breath sounds.  Abdominal:     General:  Abdomen is flat.     Palpations: Abdomen is soft.  Musculoskeletal:        General: Normal range of motion.  Skin:    General: Skin is warm and dry.  Neurological:     General: No focal deficit present.     Mental Status: He is alert. Mental status is at baseline.  Psychiatric:        Attention and Perception: Attention normal.        Mood and Affect: Mood is anxious.        Speech: Speech normal.        Behavior: Behavior is cooperative.        Thought Content: Thought content includes suicidal ideation. Thought content does not include suicidal plan.        Cognition and Memory: Cognition normal.        Judgment: Judgment normal.    Review of Systems  Constitutional: Negative.   HENT: Negative.   Eyes: Negative.   Respiratory: Negative.   Cardiovascular: Negative.   Gastrointestinal: Negative.   Musculoskeletal: Negative.   Skin: Negative.   Neurological: Negative.   Psychiatric/Behavioral: Positive for depression and suicidal ideas. Negative for hallucinations, memory loss and substance abuse. The patient is nervous/anxious and has insomnia.    Blood pressure 138/85, pulse 78, temperature 98.5 F (36.9 C), temperature source Oral, resp. rate 18, height 5\' 10"  (1.778 m), weight 108.9 kg, SpO2 99 %. Body mass index is 34.44 kg/m.  Treatment Plan Summary: Medication management and Plan 35 year old man who describes panic attacks with frequency causing emotional distress and some isolation.  Does not like being around people.  Feels depressed a lot of the time.  Suicidal ideation with no intent or plan.  No psychosis.  Patient does not meet criteria for inpatient hospitalization.  Talked with him about the appropriate treatment for anxiety including the combination of medication and therapy.  Offered patient the opportunity to start fluoxetine 20 mg a day.  Explained the potential benefit and that he needed to stay on it for weeks to see any improvement.  Discussed side effects.   Patient agreed and is given a prescription.  He is not listed as having any insurance  and would be served by [employer].  He will be given information about the liaison and appropriate follow-up at [facility] for depression and anxiety at discharge.  Case reviewed with emergency room physician and staff.  Disposition: No evidence of imminent risk to self or others at present.   Patient does not meet criteria for psychiatric inpatient admission. Supportive therapy provided about ongoing stressors. Discussed crisis plan, support from social network, calling 911, coming to the Emergency Department, and calling Suicide Hotline.  Mordecai Rasmussen, MD 08/05/2020 5:11 PM

## 2020-08-05 NOTE — ED Triage Notes (Signed)
Pt to ER with complaints of left sided chest pain, states it has been progressing over several weeks, radiates into left arm. Also reports shortness of breath and dizziness. Pt reports some foot swelling.

## 2020-08-05 NOTE — Discharge Instructions (Addendum)
Take the medication Dr. Toni Amend provided for you. Return emergency department if worsening Follow-up with your regular doctor as needed Follow-up with [facility] as instructed by Dr. Toni Amend

## 2020-09-21 ENCOUNTER — Other Ambulatory Visit: Payer: Self-pay

## 2020-09-21 ENCOUNTER — Encounter: Payer: Self-pay | Admitting: Emergency Medicine

## 2020-09-21 ENCOUNTER — Emergency Department
Admission: EM | Admit: 2020-09-21 | Discharge: 2020-09-21 | Disposition: A | Discharge status: Home / Self Care | Payer: Self-pay | Attending: Emergency Medicine | Admitting: Emergency Medicine

## 2020-09-21 DIAGNOSIS — J029 Acute pharyngitis, unspecified: Secondary | ICD-10-CM

## 2020-09-21 DIAGNOSIS — J45909 Unspecified asthma, uncomplicated: Secondary | ICD-10-CM | POA: Insufficient documentation

## 2020-09-21 DIAGNOSIS — Z20822 Contact with and (suspected) exposure to covid-19: Secondary | ICD-10-CM | POA: Insufficient documentation

## 2020-09-21 DIAGNOSIS — Z28310 Unvaccinated for covid-19: Secondary | ICD-10-CM | POA: Insufficient documentation

## 2020-09-21 DIAGNOSIS — B349 Viral infection, unspecified: Secondary | ICD-10-CM | POA: Insufficient documentation

## 2020-09-21 LAB — SARS CORONAVIRUS 2 (TAT 6-24 HRS): SARS Coronavirus 2: NEGATIVE

## 2020-09-21 LAB — GROUP A STREP BY PCR: Group A Strep by PCR: NOT DETECTED

## 2020-09-21 MED ORDER — IBUPROFEN 800 MG PO TABS
800.0000 mg | ORAL_TABLET | Freq: Once | ORAL | Status: AC
  Administered 2020-09-21: 800 mg via ORAL
  Filled 2020-09-21: qty 1

## 2020-09-21 MED ORDER — PSEUDOEPH-BROMPHEN-DM 30-2-10 MG/5ML PO SYRP: 5.0000 mL | ORAL_SOLUTION | Freq: Four times a day (QID) | ORAL | 0 refills | Status: DC | PRN

## 2020-09-21 MED ORDER — LIDOCAINE VISCOUS HCL 2 % MT SOLN: 5.0000 mL | Freq: Four times a day (QID) | OROMUCOSAL | 0 refills | Status: DC | PRN

## 2020-09-21 MED ORDER — IBUPROFEN 800 MG PO TABS: 800.0000 mg | ORAL_TABLET | Freq: Three times a day (TID) | ORAL | 0 refills | Status: DC | PRN

## 2020-09-21 NOTE — Discharge Instructions (Addendum)
Your strep test was negative.  COVID-19 results pending.  You may find results later today in the MyChart app.  If test is positive must quarantine additional 10 days.  Take medications as directed.

## 2020-09-21 NOTE — ED Triage Notes (Signed)
Pt to ED via POV stating that he is having cough, fever, chills, generalized body aches, and sore throat. Pt states that symptoms started about 1 week ago but fever just started 3 days ago. Pt denies known COVID exposure. Pt is in NAD.

## 2020-09-21 NOTE — ED Notes (Signed)
See triage note  Presents with fever and body aches  States he started out with sore throat and h/a

## 2020-09-21 NOTE — ED Provider Notes (Signed)
[facility] Emergency Department Provider Note   ____________________________________________   Event Date/Time   First MD Initiated Contact with Patient 09/21/20 0732     (approximate)  I have reviewed the triage vital signs and the nursing notes.   HISTORY  Chief Complaint Fever, Sore Throat, and Generalized Body Aches    HPI Dennis Hayes is a 35 y.o. male patient presents with cough, fever, chills, sore throat, and generalized body aches.  Patient states symptoms started a week ago where he just developed fever 3 days ago.  Patient denies recent travel or known contact with COVID-19.  Patient tested positive for COVID-19 on 12/08/2019.   Patient has not taken vaccine.  Patient rates his pain as a 9/10.  Patient described pain as "achy".  No palliative measure except for Tylenol.         Past Medical History:  Diagnosis Date   Asthma     Patient Active Problem List   Diagnosis Date Noted   Panic disorder 08/05/2020   Moderate recurrent major depression (HCC) 08/05/2020    History reviewed. No pertinent surgical history.  Prior to Admission medications   Medication Sig Start Date End Date Taking? Authorizing Provider  brompheniramine-pseudoephedrine-DM 30-2-10 MG/5ML syrup Take 5 mLs by mouth 4 (four) times daily as needed. Mix with 5 mL of viscous lidocaine for swish and swallow. 09/21/20  Yes Joni Reining, PA-C  ibuprofen (ADVIL) 800 MG tablet Take 1 tablet (800 mg total) by mouth every 8 (eight) hours as needed for moderate pain. 09/21/20  Yes Joni Reining, PA-C  lidocaine (XYLOCAINE) 2 % solution Use as directed 5 mLs in the mouth or throat every 6 (six) hours as needed for mouth pain. Mix with 5 mL of Bromfed-DM for swish and swallow. 09/21/20  Yes Joni Reining, PA-C  FLUoxetine (PROZAC) 20 MG capsule Take 1 capsule (20 mg total) by mouth daily. 08/05/20 08/05/21  Clapacs, Jackquline Denmark, MD    Allergies Shellfish allergy and Iodinated  diagnostic agents  Family History  Problem Relation Age of Onset   Heart attack Maternal Grandmother    Hypertension Maternal Grandfather     Social History Social History   Tobacco Use   Smoking status: Never   Smokeless tobacco: Never  Vaping Use   Vaping Use: Never used  Substance Use Topics   Alcohol use: No   Drug use: No    Review of Systems  Constitutional: Fever/chills and body aches.   Eyes: No visual changes. ENT: Nasal congestion and sore throat.   Cardiovascular: Denies chest pain. Respiratory: Denies shortness of breath.  Nonproductive cough. Gastrointestinal: No abdominal pain.  No nausea, no vomiting.  No diarrhea.  No constipation. Genitourinary: Negative for dysuria. Musculoskeletal: Negative for back pain. Skin: Negative for rash. Neurological: Positive for headaches, but denies focal weakness or numbness. Allergic/Immunilogical: Shellfish allergy and IVP dye ____________________________________________   PHYSICAL EXAM:  VITAL SIGNS: ED Triage Vitals  Enc Vitals Group     BP 09/21/20 0731 120/77     Pulse Rate 09/21/20 0731 (!) 110     Resp 09/21/20 0731 18     Temp 09/21/20 0731 (!) 101.4 F (38.6 C)     Temp Source 09/21/20 0731 Oral     SpO2 09/21/20 0731 95 %     Weight 09/21/20 0720 230 lb (104.3 kg)     Height 09/21/20 0720 5\' 10"  (1.778 m)     Head Circumference --  Peak Flow --      Pain Score 09/21/20 0720 6     Pain Loc --      Pain Edu? --      Excl. in GC? --     Constitutional: Alert and oriented. Well appearing and in no acute distress. Eyes: Conjunctivae are normal. PERRL. EOMI. Head: Atraumatic. Nose: Edematous nasal turbinates clear rhinorrhea. Mouth/Throat: Mucous membranes are moist.  Oropharynx non-erythematous.  Postnasal drainage. Neck: No stridor.   Hematological/Lymphatic/Immunilogical: No cervical lymphadenopathy. Cardiovascular: Tachycardic, regular rhythm. Grossly normal heart sounds.  Good peripheral  circulation. Respiratory: Normal respiratory effort.  No retractions. Lungs CTAB. Gastrointestinal: Soft and nontender. No distention. No abdominal bruits. No CVA tenderness. Musculoskeletal: No lower extremity tenderness nor edema.  No joint effusions. Neurologic:  Normal speech and language. No gross focal neurologic deficits are appreciated. No gait instability. Skin:  Skin is warm, dry and intact. No rash noted. Psychiatric: Mood and affect are normal. Speech and behavior are normal.  ____________________________________________   LABS (all labs ordered are listed, but only abnormal results are displayed)  Labs Reviewed  GROUP A STREP BY PCR  SARS CORONAVIRUS 2 (TAT 6-24 HRS)   ____________________________________________  EKG   ____________________________________________  RADIOLOGY Margarite Gouge, personally viewed and evaluated these images (plain radiographs) as part of my medical decision making, as well as reviewing the written report by the radiologist.  ED MD interpretation:    Official radiology report(s): No results found.  ____________________________________________   PROCEDURES  Procedure(s) performed (including Critical Care):  Procedures   ____________________________________________   INITIAL IMPRESSION / ASSESSMENT AND PLAN / ED COURSE  As part of my medical decision making, I reviewed the following data within the electronic MEDICAL RECORD NUMBER         Patient presents with cough, fever, chills, body aches, sore throat.  Patient rapid strep test was negative.  COVID-19 results are pending.  Patient given discharge care instruction advised self quarantine pending results of COVID-19 test.  If test is positive must quarantine additional 10 days.  Take medication as directed.      ____________________________________________   FINAL CLINICAL IMPRESSION(S) / ED DIAGNOSES  Final diagnoses:  Viral illness  Sore throat     ED  Discharge Orders          Ordered    lidocaine (XYLOCAINE) 2 % solution  Every 6 hours PRN        09/21/20 0917    brompheniramine-pseudoephedrine-DM 30-2-10 MG/5ML syrup  4 times daily PRN        09/21/20 0917    ibuprofen (ADVIL) 800 MG tablet  Every 8 hours PRN        09/21/20 3664             Note:  This document was prepared using Dragon voice recognition software and may include unintentional dictation errors.    Joni Reining, PA-C 09/21/20 0920    Minna Antis, MD 09/21/20 [phone]

## 2021-02-04 ENCOUNTER — Emergency Department
Admission: EM | Admit: 2021-02-04 | Discharge: 2021-02-04 | Disposition: A | Discharge status: Home / Self Care | Payer: Self-pay | Attending: Emergency Medicine | Admitting: Emergency Medicine

## 2021-02-04 ENCOUNTER — Other Ambulatory Visit: Payer: Self-pay

## 2021-02-04 DIAGNOSIS — R112 Nausea with vomiting, unspecified: Secondary | ICD-10-CM | POA: Insufficient documentation

## 2021-02-04 DIAGNOSIS — J45909 Unspecified asthma, uncomplicated: Secondary | ICD-10-CM | POA: Insufficient documentation

## 2021-02-04 LAB — URINALYSIS, ROUTINE W REFLEX MICROSCOPIC
Bilirubin Urine: NEGATIVE
Glucose, UA: NEGATIVE mg/dL
Hgb urine dipstick: NEGATIVE
Ketones, ur: 5 mg/dL — AB
Leukocytes,Ua: NEGATIVE
Nitrite: NEGATIVE
Protein, ur: NEGATIVE mg/dL
Specific Gravity, Urine: 1.032 — ABNORMAL HIGH (ref 1.005–1.030)
pH: 5 (ref 5.0–8.0)

## 2021-02-04 LAB — CBC
HCT: 48.4 % (ref 39.0–52.0)
Hemoglobin: 16.3 g/dL (ref 13.0–17.0)
MCH: 28.6 pg (ref 26.0–34.0)
MCHC: 33.7 g/dL (ref 30.0–36.0)
MCV: 84.9 fL (ref 80.0–100.0)
Platelets: 353 10*3/uL (ref 150–400)
RBC: 5.7 MIL/uL (ref 4.22–5.81)
RDW: 12.8 % (ref 11.5–15.5)
WBC: 10.3 10*3/uL (ref 4.0–10.5)
nRBC: 0 % (ref 0.0–0.2)

## 2021-02-04 LAB — COMPREHENSIVE METABOLIC PANEL
ALT: 25 U/L (ref 0–44)
AST: 33 U/L (ref 15–41)
Albumin: 3.9 g/dL (ref 3.5–5.0)
Alkaline Phosphatase: 54 U/L (ref 38–126)
Anion gap: 8 (ref 5–15)
BUN: 11 mg/dL (ref 6–20)
CO2: 27 mmol/L (ref 22–32)
Calcium: 8.8 mg/dL — ABNORMAL LOW (ref 8.9–10.3)
Chloride: 103 mmol/L (ref 98–111)
Creatinine, Ser: 1.08 mg/dL (ref 0.61–1.24)
GFR, Estimated: >60 mL/min (ref >60)
Glucose, Bld: 96 mg/dL (ref 70–99)
Potassium: 3.5 mmol/L (ref 3.5–5.1)
Sodium: 138 mmol/L (ref 135–145)
Total Bilirubin: 0.8 mg/dL (ref 0.3–1.2)
Total Protein: 7.1 g/dL (ref 6.5–8.1)

## 2021-02-04 LAB — LIPASE, BLOOD: Lipase: 32 U/L (ref 11–51)

## 2021-02-04 MED ORDER — FAMOTIDINE 20 MG PO TABS: 20.0000 mg | ORAL_TABLET | Freq: Two times a day (BID) | ORAL | 0 refills | Status: DC

## 2021-02-04 MED ORDER — SODIUM CHLORIDE 0.9 % IV BOLUS
1000.0000 mL | Freq: Once | INTRAVENOUS | Status: AC
  Administered 2021-02-04: 1000 mL via INTRAVENOUS

## 2021-02-04 MED ORDER — ONDANSETRON 8 MG PO TBDP: 8.0000 mg | ORAL_TABLET | Freq: Three times a day (TID) | ORAL | 0 refills | Status: DC | PRN

## 2021-02-04 MED ORDER — ONDANSETRON HCL 4 MG/2ML IJ SOLN
4.0000 mg | Freq: Once | INTRAMUSCULAR | Status: AC
  Administered 2021-02-04: 4 mg via INTRAVENOUS
  Filled 2021-02-04: qty 2

## 2021-02-04 NOTE — ED Triage Notes (Signed)
Pt comes with c/o N/V since yesterday. Pt denies any pain.

## 2021-02-04 NOTE — Discharge Instructions (Addendum)
You may take the Zofran as needed for nausea over the next several days.  Take the Pepcid to decrease stomach acid.  Return to the ER for new, worsening, or persistent severe vomiting, or if you develop abdominal pain, fever, weakness, or any other new or worsening symptoms that concern you.

## 2021-02-04 NOTE — ED Provider Notes (Signed)
[facility] Emergency Department Provider Note ____________________________________________   Event Date/Time   First MD Initiated Contact with Patient 02/04/21 1222     (approximate)  I have reviewed the triage vital signs and the nursing notes.   HISTORY  Chief Complaint Nausea    HPI Dennis Hayes is a 35 y.o. male with PMH as noted below who presents with nausea and vomiting since 5 PM yesterday, starting a short time after eating [employer].  He denies any associated abdominal pain.  He has had no diarrhea.  He has no fever or chills.  The patient denies any other sick contacts, out of the ordinary foods, and has not traveled.  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 No pertinent surgical history.  Prior to Admission medications   Medication Sig Start Date End Date Taking? Authorizing Provider  brompheniramine-pseudoephedrine-DM 30-2-10 MG/5ML syrup Take 5 mLs by mouth 4 (four) times daily as needed. Mix with 5 mL of viscous lidocaine for swish and swallow. 09/21/20   Sable Feil, PA-C  FLUoxetine (PROZAC) 20 MG capsule Take 1 capsule (20 mg total) by mouth daily. 08/05/20 08/05/21  Clapacs, Madie Reno, MD  ibuprofen (ADVIL) 800 MG tablet Take 1 tablet (800 mg total) by mouth every 8 (eight) hours as needed for moderate pain. 09/21/20   Sable Feil, PA-C  lidocaine (XYLOCAINE) 2 % solution Use as directed 5 mLs in the mouth or throat every 6 (six) hours as needed for mouth pain. Mix with 5 mL of Bromfed-DM for swish and swallow. 09/21/20   Sable Feil, PA-C    Allergies Shellfish allergy and Iodinated diagnostic agents  Family History  Problem Relation Age of Onset   Heart attack Maternal Grandmother    Hypertension Maternal Grandfather     Social History Social History    Tobacco Use   Smoking status: Never   Smokeless tobacco: Never  Vaping Use   Vaping Use: Never used  Substance Use Topics   Alcohol use: No   Drug use: No    Review of Systems  Constitutional: No fever/chills Eyes: No visual changes. ENT: No sore throat. Cardiovascular: Denies chest pain. Respiratory: Denies shortness of breath. Gastrointestinal: Positive for nausea and vomiting. Genitourinary: Negative for dysuria.  Musculoskeletal: Negative for back pain. Skin: Negative for rash. Neurological: Negative for headaches, focal weakness or numbness.   ____________________________________________   PHYSICAL EXAM:  VITAL SIGNS: ED Triage Vitals  Enc Vitals Group     BP 02/04/21 1005 117/81     Pulse Rate 02/04/21 1005 88     Resp 02/04/21 1005 20     Temp 02/04/21 1005 98.3 F (36.8 C)     Temp Source 02/04/21 1005 Oral     SpO2 02/04/21 1005 96 %     Weight 02/04/21 1005 230 lb (104.3 kg)     Height 02/04/21 1005 5\' 10"  (1.778 m)     Head Circumference --      Peak Flow --      Pain Score 02/04/21 0948 0     Pain Loc --      Pain Edu? --      Excl. in [place]? --     Constitutional: Alert and oriented. Well appearing and in no acute distress. Eyes: Conjunctivae are  normal.  Head: Atraumatic. Nose: No congestion/rhinnorhea. Mouth/Throat: Mucous membranes are slightly dry. Neck: Normal range of motion.  Cardiovascular: Normal rate, regular rhythm. Good peripheral circulation. Respiratory: Normal respiratory effort.  No retractions.  Gastrointestinal: Soft and nontender. No distention.  Genitourinary: No flank tenderness. Musculoskeletal: Extremities warm and well perfused.  Neurologic:  Normal speech and language. No gross focal neurologic deficits are appreciated.  Skin:  Skin is warm and dry. No rash noted. Psychiatric: Mood and affect are normal. Speech and behavior are normal.  ____________________________________________   LABS (all labs ordered are  listed, but only abnormal results are displayed)  Labs Reviewed  COMPREHENSIVE METABOLIC PANEL - Abnormal; Notable for the following components:      Result Value   Calcium 8.8 (*)    All other components within normal limits  URINALYSIS, ROUTINE W REFLEX MICROSCOPIC - Abnormal; Notable for the following components:   Color, Urine YELLOW (*)    APPearance HAZY (*)    Specific Gravity, Urine 1.032 (*)    Ketones, ur 5 (*)    All other components within normal limits  LIPASE, BLOOD  CBC   ____________________________________________  EKG   ____________________________________________  RADIOLOGY    ____________________________________________   PROCEDURES  Procedure(s) performed: No  Procedures  Critical Care performed: No ____________________________________________   INITIAL IMPRESSION / ASSESSMENT AND PLAN / ED COURSE  Pertinent labs & imaging results that were available during my care of the patient were reviewed by me and considered in my medical decision making (see chart for details).   35 year old male with PMH as noted above presents with persistent nausea and vomiting after eating Bojangles yesterday afternoon, but has no abdominal pain or diarrhea.  On exam his vital signs are normal, he is well-appearing, and the abdomen is soft and nontender.  Lab work-up obtained from triage is reassuring.  Overall presentation is most consistent with foodborne illness, viral gastroenteritis, or gastritis.  There is no evidence of pancreatitis, colitis, or other acute intra-abdominal cause.  There is no indication for imaging at this time.  Will give IV fluids and Zofran and reassess.  ----------------------------------------- 5:00 PM on 02/04/2021 -----------------------------------------  The nausea has significantly improved and the patient is tolerating p.o.  He is stable for discharge at this time.  I counseled him on the results of the work-up.  I have prescribed  ODT Zofran as well as Pepcid.  Return precautions given, and he expresses understanding.  ____________________________________________   FINAL CLINICAL IMPRESSION(S) / ED DIAGNOSES  Final diagnoses:  None      NEW MEDICATIONS STARTED DURING THIS VISIT:  New Prescriptions   No medications on file     Note:  This document was prepared using Dragon voice recognition software and may include unintentional dictation errors.    Dionne Bucy, MD 02/04/21 1701

## 2021-03-03 ENCOUNTER — Emergency Department
Admission: EM | Admit: 2021-03-03 | Discharge: 2021-03-03 | Disposition: A | Discharge status: Home / Self Care | Payer: Self-pay | Attending: Student in an Organized Health Care Education/Training Program | Admitting: Student in an Organized Health Care Education/Training Program

## 2021-03-03 ENCOUNTER — Encounter: Payer: Self-pay | Admitting: Emergency Medicine

## 2021-03-03 ENCOUNTER — Other Ambulatory Visit: Payer: Self-pay

## 2021-03-03 DIAGNOSIS — J45909 Unspecified asthma, uncomplicated: Secondary | ICD-10-CM | POA: Insufficient documentation

## 2021-03-03 DIAGNOSIS — Z20822 Contact with and (suspected) exposure to covid-19: Secondary | ICD-10-CM | POA: Insufficient documentation

## 2021-03-03 DIAGNOSIS — J101 Influenza due to other identified influenza virus with other respiratory manifestations: Secondary | ICD-10-CM | POA: Insufficient documentation

## 2021-03-03 LAB — GROUP A STREP BY PCR: Group A Strep by PCR: NOT DETECTED

## 2021-03-03 LAB — RESP PANEL BY RT-PCR (FLU A&B, COVID) ARPGX2
Influenza A by PCR: POSITIVE — AB
Influenza B by PCR: NEGATIVE
SARS Coronavirus 2 by RT PCR: NEGATIVE

## 2021-03-03 MED ORDER — ALBUTEROL SULFATE HFA 108 (90 BASE) MCG/ACT IN AERS: 2.0000 | INHALATION_SPRAY | Freq: Four times a day (QID) | RESPIRATORY_TRACT | 2 refills | Status: AC | PRN

## 2021-03-03 MED ORDER — IBUPROFEN 600 MG PO TABS
600.0000 mg | ORAL_TABLET | Freq: Once | ORAL | Status: AC
  Administered 2021-03-03: 600 mg via ORAL
  Filled 2021-03-03: qty 1

## 2021-03-03 MED ORDER — IPRATROPIUM-ALBUTEROL 0.5-2.5 (3) MG/3ML IN SOLN
3.0000 mL | Freq: Once | RESPIRATORY_TRACT | Status: AC
  Administered 2021-03-03: 3 mL via RESPIRATORY_TRACT
  Filled 2021-03-03: qty 3

## 2021-03-03 MED ORDER — PREDNISONE 20 MG PO TABS
60.0000 mg | ORAL_TABLET | Freq: Once | ORAL | Status: AC
  Administered 2021-03-03: 60 mg via ORAL
  Filled 2021-03-03: qty 3

## 2021-03-03 MED ORDER — PREDNISONE 20 MG PO TABS: 40.0000 mg | ORAL_TABLET | Freq: Every day | ORAL | 0 refills | Status: AC

## 2021-03-03 NOTE — ED Notes (Signed)
Pt to ED c/o loss of appetite, body aches and HA, chills, SOB, sore throat, runny nose, cough, pain with coughing since 3 days (sore throat started today).  Pt appears to have unlabored respirations, lungs sound clear, speaks in full sentences. Hx asthma.

## 2021-03-03 NOTE — ED Notes (Signed)
Provider at bedside

## 2021-03-03 NOTE — ED Provider Notes (Signed)
[facility] Emergency Department Provider Note    Event Date/Time   First MD Initiated Contact with Patient 03/03/21 [phone]     (approximate)  I have reviewed the triage vital signs and the nursing notes.   HISTORY  Chief Complaint Sore Throat, Fever, and Nasal Congestion    HPI Dennis Hayes is a 35 y.o. male with a history of asthma presents to the ER for evaluation of cough shortness of breath sore throat feel like his asthma is acting up, myalgias chills for 3 days.  Denies any abdominal pain.  Did not take anything for discomfort this morning.  Has been using his albuterol inhaler several days states he only has 3 or 4 puffs left.  Past Medical History:  Diagnosis Date   Asthma    Family History  Problem Relation Age of Onset   Heart attack Maternal Grandmother    Hypertension Maternal Grandfather    No past surgical history on file. Patient Active Problem List   Diagnosis Date Noted   Panic disorder 08/05/2020   Moderate recurrent major depression ([facility]) 08/05/2020      Prior to Admission medications   Medication Sig Start Date End Date Taking? Authorizing Provider  albuterol (VENTOLIN HFA) 108 (90 Base) MCG/ACT inhaler Inhale 2 puffs into the lungs every 6 (six) hours as needed for wheezing or shortness of breath. 03/03/21  Yes Willy Eddy, MD  predniSONE (DELTASONE) 20 MG tablet Take 2 tablets (40 mg total) by mouth daily for 5 days. 03/03/21 03/08/21 Yes Willy Eddy, MD  brompheniramine-pseudoephedrine-DM 30-2-10 MG/5ML syrup Take 5 mLs by mouth 4 (four) times daily as needed. Mix with 5 mL of viscous lidocaine for swish and swallow. 09/21/20   Joni Reining, PA-C  famotidine (PEPCID) 20 MG tablet Take 1 tablet (20 mg total) by mouth 2 (two) times daily for 7 days. 02/04/21 02/11/21  Dionne Bucy, MD  FLUoxetine (PROZAC) 20 MG capsule Take 1 capsule (20 mg total) by mouth daily. 08/05/20 08/05/21  Clapacs, Jackquline Denmark, MD  ibuprofen  (ADVIL) 800 MG tablet Take 1 tablet (800 mg total) by mouth every 8 (eight) hours as needed for moderate pain. 09/21/20   Joni Reining, PA-C  lidocaine (XYLOCAINE) 2 % solution Use as directed 5 mLs in the mouth or throat every 6 (six) hours as needed for mouth pain. Mix with 5 mL of Bromfed-DM for swish and swallow. 09/21/20   Joni Reining, PA-C  ondansetron (ZOFRAN ODT) 8 MG disintegrating tablet Take 1 tablet (8 mg total) by mouth every 8 (eight) hours as needed for nausea or vomiting. 02/04/21   Dionne Bucy, MD    Allergies Shellfish allergy and Iodinated diagnostic agents    Social History Social History   Tobacco Use   Smoking status: Never   Smokeless tobacco: Never  Vaping Use   Vaping Use: Never used  Substance Use Topics   Alcohol use: No   Drug use: No    Review of Systems Patient denies headaches, rhinorrhea, blurry vision, numbness, shortness of breath, chest pain, edema, cough, abdominal pain, nausea, vomiting, diarrhea, dysuria, fevers, rashes or hallucinations unless otherwise stated above in HPI. ____________________________________________   PHYSICAL EXAM:  VITAL SIGNS: Vitals:   03/03/21 0915 03/03/21 1134  BP: 114/75   Pulse: 81   Resp: 18 18  Temp: 99.7 F (37.6 C)   SpO2: 97% 98%    Constitutional: Alert and oriented.  Eyes: Conjunctivae are normal.  Head: Atraumatic. Nose:  No congestion/rhinnorhea. Mouth/Throat: Mucous membranes are moist.   Neck: No stridor. Painless ROM.  Cardiovascular: Normal rate, regular rhythm. Grossly normal heart sounds.  Good peripheral circulation. Respiratory: Normal respiratory effort.  No retractions. Lungs with scattered muscial wheeze Gastrointestinal: Soft and nontender. No distention. No abdominal bruits. No CVA tenderness. Genitourinary:  Musculoskeletal: No lower extremity tenderness nor edema.  No joint effusions. Neurologic:  Normal speech and language. No gross focal neurologic deficits are  appreciated. No facial droop Skin:  Skin is warm, dry and intact. No rash noted. Psychiatric: Mood and affect are normal. Speech and behavior are normal.  ____________________________________________   LABS (all labs ordered are listed, but only abnormal results are displayed)  Results for orders placed or performed during the hospital encounter of 03/03/21 (from the past 24 hour(s))  Resp Panel by RT-PCR (Flu A&B, Covid) Nasopharyngeal Swab     Status: Abnormal   Collection Time: 03/03/21  9:26 AM   Specimen: Nasopharyngeal Swab; Nasopharyngeal(NP) swabs in vial transport medium  Result Value Ref Range   SARS Coronavirus 2 by RT PCR NEGATIVE NEGATIVE   Influenza A by PCR POSITIVE (A) NEGATIVE   Influenza B by PCR NEGATIVE NEGATIVE  Group A Strep by PCR ([facility] Only)     Status: None   Collection Time: 03/03/21  9:26 AM   Specimen: Nasopharyngeal Swab; Sterile Swab  Result Value Ref Range   Group A Strep by PCR NOT DETECTED NOT DETECTED   _______________________________________________________________   PROCEDURES  Procedure(s) performed:  Procedures    Critical Care performed: no ____________________________________________   INITIAL IMPRESSION / ASSESSMENT AND PLAN / ED COURSE  Pertinent labs & imaging results that were available during my care of the patient were reviewed by me and considered in my medical decision making (see chart for details).   DDX: flu, covid, asthma, pna, uri  Dennis Hayes is a 35 y.o. who presents to the ED with symptoms as described above.  Patient well-appearing no acute distress.  Symptoms and presentation consistent with flu A.  No respiratory distress low-grade temperature.  Do not feel that radiographs clinically indicated nor blood work at this time.  Patient given nebulizer with improvement in symptoms.  May have a component of asthma exacerbation in the setting of flu therefore will treat with steroid and albuterol.  Will give refill for  his albuterol inhaler.  Discussed symptomatic management and signs and symptoms for which he should return to the ER.     The patient was evaluated in Emergency Department today for the symptoms described in the history of present illness. He/she was evaluated in the context of the global COVID-19 pandemic, which necessitated consideration that the patient might be at risk for infection with the SARS-CoV-2 virus that causes COVID-19. Institutional protocols and algorithms that pertain to the evaluation of patients at risk for COVID-19 are in a state of rapid change based on information released by regulatory bodies including the CDC and federal and state organizations. These policies and algorithms were followed during the patient's care in the ED.  As part of my medical decision making, I reviewed the following data within the electronic MEDICAL RECORD NUMBER Nursing notes reviewed and incorporated, Labs reviewed, notes from prior ED visits and [place] Controlled Substance Database   ____________________________________________   FINAL CLINICAL IMPRESSION(S) / ED DIAGNOSES  Final diagnoses:  Influenza A  Mild asthma, unspecified whether complicated, unspecified whether persistent      NEW MEDICATIONS STARTED DURING THIS VISIT:  Discharge  Medication List as of 03/03/2021 11:30 AM     START taking these medications   Details  albuterol (VENTOLIN HFA) 108 (90 Base) MCG/ACT inhaler Inhale 2 puffs into the lungs every 6 (six) hours as needed for wheezing or shortness of breath., Starting Thu 03/03/2021, Normal    predniSONE (DELTASONE) 20 MG tablet Take 2 tablets (40 mg total) by mouth daily for 5 days., Starting Thu 03/03/2021, Until Tue 03/08/2021, Normal         Note:  This document was prepared using Dragon voice recognition software and may include unintentional dictation errors.    Willy Eddy, MD 03/03/21 1539

## 2021-03-03 NOTE — ED Triage Notes (Signed)
Pt to ED via POV with c/o sore throat, chills, fever, headache and having a flare of his asthma. He is able to speak in complete clear sentences.

## 2021-04-18 ENCOUNTER — Emergency Department
Admission: EM | Admit: 2021-04-18 | Discharge: 2021-04-18 | Disposition: A | Discharge status: Home / Self Care | Payer: Self-pay | Attending: Emergency Medicine | Admitting: Emergency Medicine

## 2021-04-18 ENCOUNTER — Other Ambulatory Visit: Payer: Self-pay

## 2021-04-18 DIAGNOSIS — K029 Dental caries, unspecified: Secondary | ICD-10-CM | POA: Insufficient documentation

## 2021-04-18 MED ORDER — IBUPROFEN 800 MG PO TABS: 800.0000 mg | ORAL_TABLET | Freq: Three times a day (TID) | ORAL | 0 refills | Status: DC | PRN

## 2021-04-18 MED ORDER — AMOXICILLIN 875 MG PO TABS: 875.0000 mg | ORAL_TABLET | Freq: Two times a day (BID) | ORAL | 0 refills | Status: DC

## 2021-04-18 NOTE — ED Triage Notes (Signed)
Pt c/o right upper tooth pain for "awhile" but worse in the past 24hrs

## 2021-04-18 NOTE — Discharge Instructions (Signed)
OPTIONS FOR DENTAL FOLLOW UP CARE ° °[place] Department of Health and Human Services - Local Safety Net Dental Clinics °http://www.ncdhhs.gov/dph/oralhealth/services/safetynetclinics.htm °  °[facility] (336-562-3123) ° °[facility] [place] ([phone]) ° °[facility] ([phone] ext [phone]) ° °[facility] ([phone]) ° °[facility] ([phone]) °This clinic caters to the indigent population and is on a lottery system. °Location: °[facility], [address], [place] °Clinic Hours: °Wednesdays from 6pm - 9pm, patients seen by a lottery system. °For dates, call or go to www.med.[facility].edu/[facility]/patients/Dental-[facility] °Services: °Cleanings, fillings and simple extractions. °Payment Options: °DENTAL WORK IS FREE OF CHARGE. Bring proof of income or support. °Best way to get seen: °Arrive at 5:15 pm - this is a lottery, NOT first come/first serve, so arriving earlier will not increase your chances of being seen. °  °  °[facility] °[phone] °Select option 1 for emergencies °  °Location: °[facility], [address], [place] °Clinic Hours: °No walk-ins accepted - call the day before to schedule an appointment. °Check in times are 9:30 am and 1:30 pm. °Services: °Simple extractions, temporary fillings, pulpectomy/pulp debridement, uncomplicated abscess drainage. °Payment Options: °PAYMENT IS DUE AT THE TIME OF SERVICE.  Fee is usually $100-200, additional surgical procedures (e.g. abscess drainage) may be extra. °Cash, checks, Visa/MasterCard accepted.  Can file Medicaid if patient is covered for dental - patient should call case worker to check. °No discount for [facility] patients. °Best way to get seen: °MUST call the day before and get onto the schedule. Can usually be seen the next 1-2 days. No walk-ins accepted. °  °  °[facility] °[phone] °   °Location: °[facility], [address], [place] °Clinic Hours: °M, W, Th, F 8am or 1:30pm, Tues 9a or 1:30 - first come/first served. °Services: °Simple extractions, temporary fillings, uncomplicated abscess drainage.  You do not need to be an [place] resident. °Payment Options: °PAYMENT IS DUE AT THE TIME OF SERVICE. °Dental insurance, otherwise sliding scale - bring proof of income or support. °Depending on income and treatment needed, cost is usually $50-200. °Best way to get seen: °Arrive early as it is first come/first served. °  °  °[facility] °[phone] °  °Location: °[address] °Clinic Hours: °Mon-Thu 8a-5p °Services: °Most basic dental services including extractions and fillings. °Payment Options: °PAYMENT IS DUE AT THE TIME OF SERVICE. °Sliding scale, up to 50% off - bring proof if income or support. °Medicaid with dental option accepted. °Best way to get seen: °Call to schedule an appointment, can usually be seen within 2 weeks OR they will try to see walk-ins - show up at 8a or 2p (you may have to wait). °  °  °[facility] °919-245-2435 °[place] RESIDENTS ONLY °  °Location: °[facility], [address], [place], [place] [address] °Clinic Hours: By appointment only. °Monday - Thursday 8am-5pm, Friday 8am-12pm °Services: Cleanings, fillings, extractions. °Payment Options: °PAYMENT IS DUE AT THE TIME OF SERVICE. °Cash, Visa or MasterCard. Sliding scale - $30 minimum per service. °Best way to get seen: °Come in to office, complete packet and make an appointment - need proof of income °or support monies for each household member and proof of [place] residence. °Usually takes about a month to get in. °  °  °[facility] °[phone] °  °Location: °[address]   [place] °Clinic Hours: Walk-in Urgent Care Dental Services are offered Monday-Friday  mornings only. °The numbers of emergencies accepted daily is limited to the number of °providers available. °Maximum 15 - Mondays, Wednesdays & Thursdays °Maximum 10 - Tuesdays & Fridays °Services: °You do not need to be a [place] resident to be seen for a dental emergency. °Emergencies are defined as pain, swelling, abnormal bleeding, or dental trauma. Walkins will receive x-rays if needed. °NOTE: Dental cleaning is not an emergency. °Payment Options: °PAYMENT IS DUE AT THE TIME OF SERVICE. °Minimum co-pay is $40.00 for uninsured patients. °Minimum co-pay is $3.00 for Medicaid with dental coverage. °Dental Insurance is accepted and must be presented at time of visit. °Medicare does not cover dental. °Forms of payment: Cash, credit card, checks. °Best way to get seen: °If not previously registered with the clinic, walk-in dental registration begins at 7:15 am and is on a first come/first serve basis. °If previously registered with the clinic, call to make an appointment. °  °  °[facility] °[phone] °[place] RESIDENTS ONLY °  °Location: °[address], [place], [place] °Clinic Hours: °Mon-Thu 10a-2p °Services: Extractions only! °Payment Options: °FREE (donations accepted) - bring proof of income or support °Best way to get seen: °Call and schedule an appointment OR come at 8am on the 1st Monday of every month (except for holidays) when it is first come/first served. °  °  °[facility] °[phone] °  °Location: °[address], [place] °Clinic Hours: °Friday mornings °Services, Payment Options, Best way to get seen: °Call for info °

## 2021-04-18 NOTE — ED Provider Notes (Signed)
[facility] Provider Note    Event Date/Time   First MD Initiated Contact with Patient 04/18/21 [phone]     (approximate)   History   Dental Pain   HPI  Dennis Hayes is a 36 y.o. male presents emergency department complaining of right upper tooth pain.  Patient states he thinks it is his wisdom tooth and it is broken off.  No fever or chills.  No chest pain shortness of breath.  Patient does have a regular dentist.      Physical Exam   Triage Vital Signs: ED Triage Vitals  Enc Vitals Group     BP 04/18/21 0749 129/86     Pulse Rate 04/18/21 0749 70     Resp 04/18/21 0749 18     Temp 04/18/21 0749 98.5 F (36.9 C)     Temp Source 04/18/21 0749 Oral     SpO2 04/18/21 0749 100 %     Weight --      Height --      Head Circumference --      Peak Flow --      Pain Score 04/18/21 0748 10     Pain Loc --      Pain Edu? --      Excl. in [place]? --     Most recent vital signs: Vitals:   04/18/21 0749  BP: 129/86  Pulse: 70  Resp: 18  Temp: 98.5 F (36.9 C)  SpO2: 100%     General: Awake, no distress.   CV:  Good peripheral perfusion.  Regular rate and rhythm Resp:  Normal effort.  Lungs CTA Abd:  No distention.   Other:  Right upper molar is fractured in half, gum is slightly swollen around this tooth   ED Results / Procedures / Treatments   Labs (all labs ordered are listed, but only abnormal results are displayed) Labs Reviewed - No data to display   EKG     RADIOLOGY     PROCEDURES:   Procedures   MEDICATIONS ORDERED IN ED: Medications - No data to display   IMPRESSION / MDM / [place] / ED COURSE  I reviewed the triage vital signs and the nursing notes.                              Differential diagnosis includes, but is not limited to, dental abscess, dental fracture, dental caries  Exam is consistent with patient's complaint of dental pain.  He has not seen his dentist recently.  Not take  anything for pain.  He was given a prescription for amoxicillin as his tooth is open and fractured, also given prescription for ibuprofen 800 mg.  His prescriptions were sent to [facility].  He is to follow-up with his regular dentist or one of the dental clinics listed on his discharge papers.  Patient is in agreement treatment plan.  Discharged stable condition.      FINAL CLINICAL IMPRESSION(S) / ED DIAGNOSES   Final diagnoses:  Pain due to dental caries     Rx / DC Orders   ED Discharge Orders          Ordered    amoxicillin (AMOXIL) 875 MG tablet  2 times daily        04/18/21 0814    ibuprofen (ADVIL) 800 MG tablet  Every 8 hours PRN  04/18/21 [id number]             Note:  This document was prepared using Dragon voice recognition software and may include unintentional dictation errors.    Versie Starks, PA-C 04/18/21 [id number]    Duffy Bruce, MD 04/18/21 [phone]

## 2021-08-21 IMAGING — DX DG CHEST 1V PORT
2 series · 2 of 2 positions shown · non-contrast
Comparison: April 22, 2019

CLINICAL DATA: Shortness of breath

EXAM:
PORTABLE CHEST 1 VIEW

[chest ap (1 of 2)]
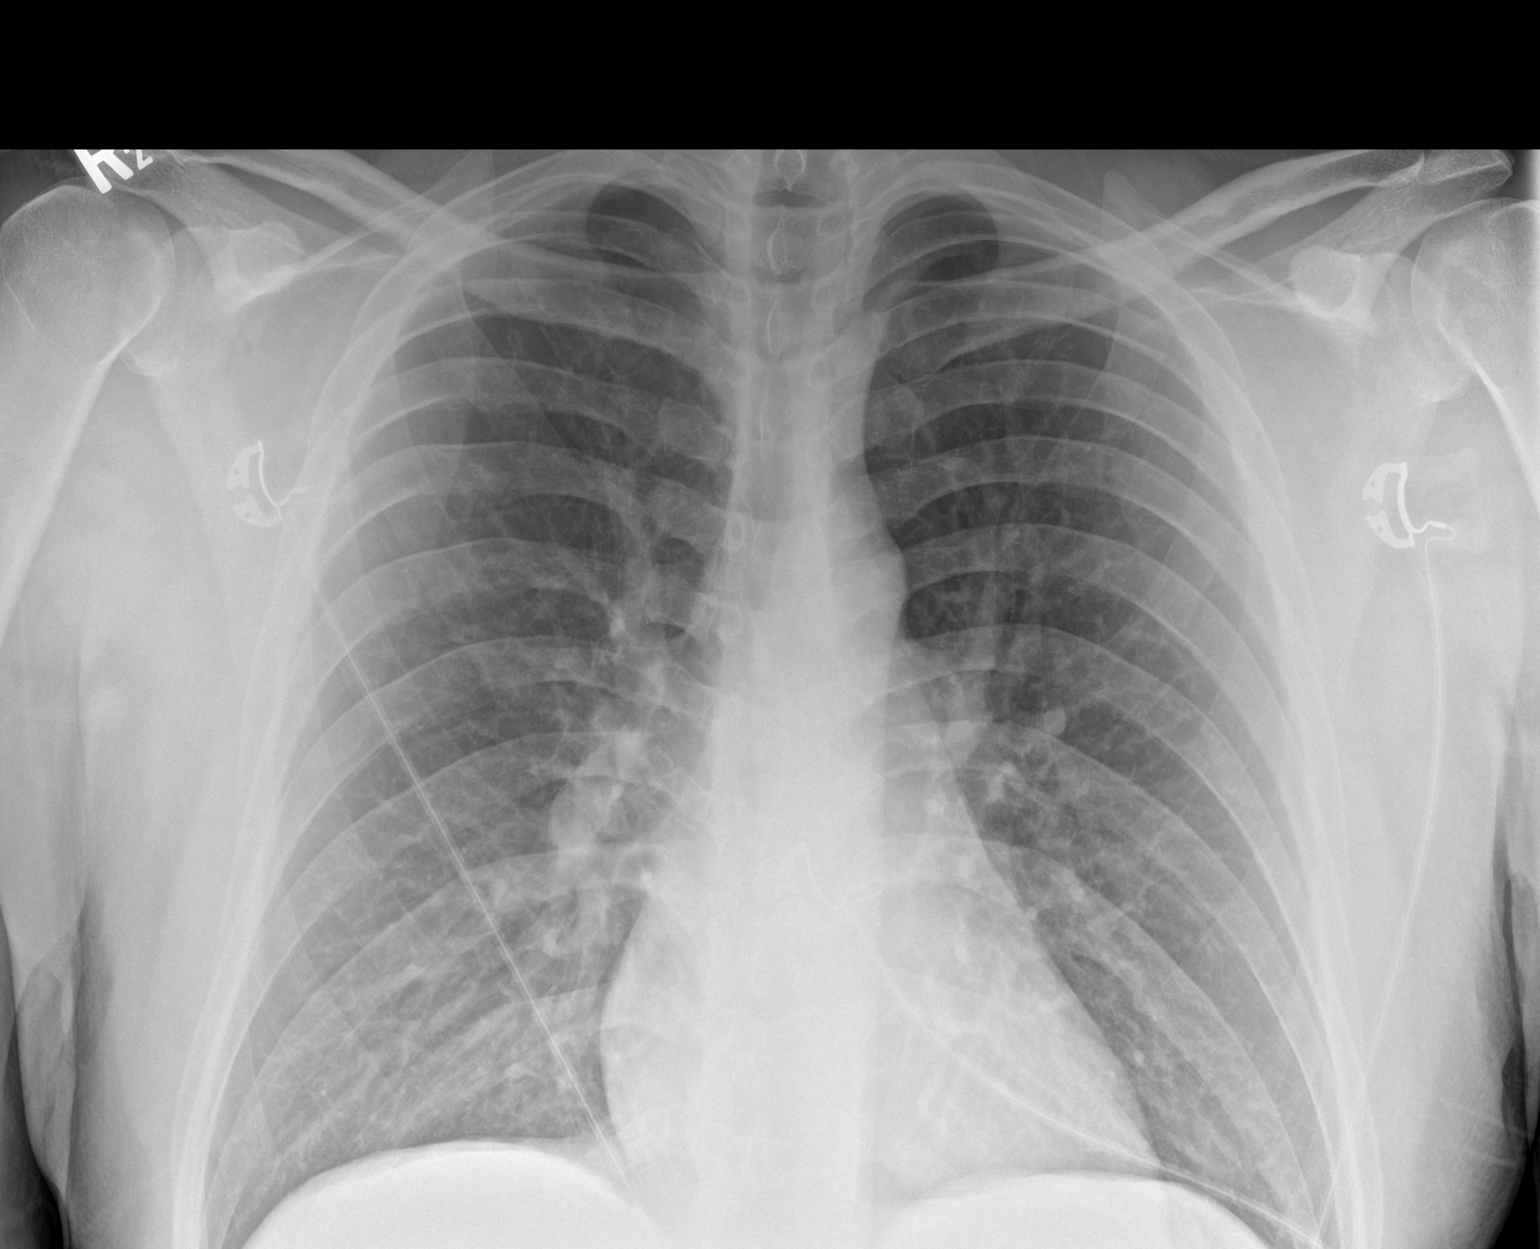

[chest ap (2 of 2)]
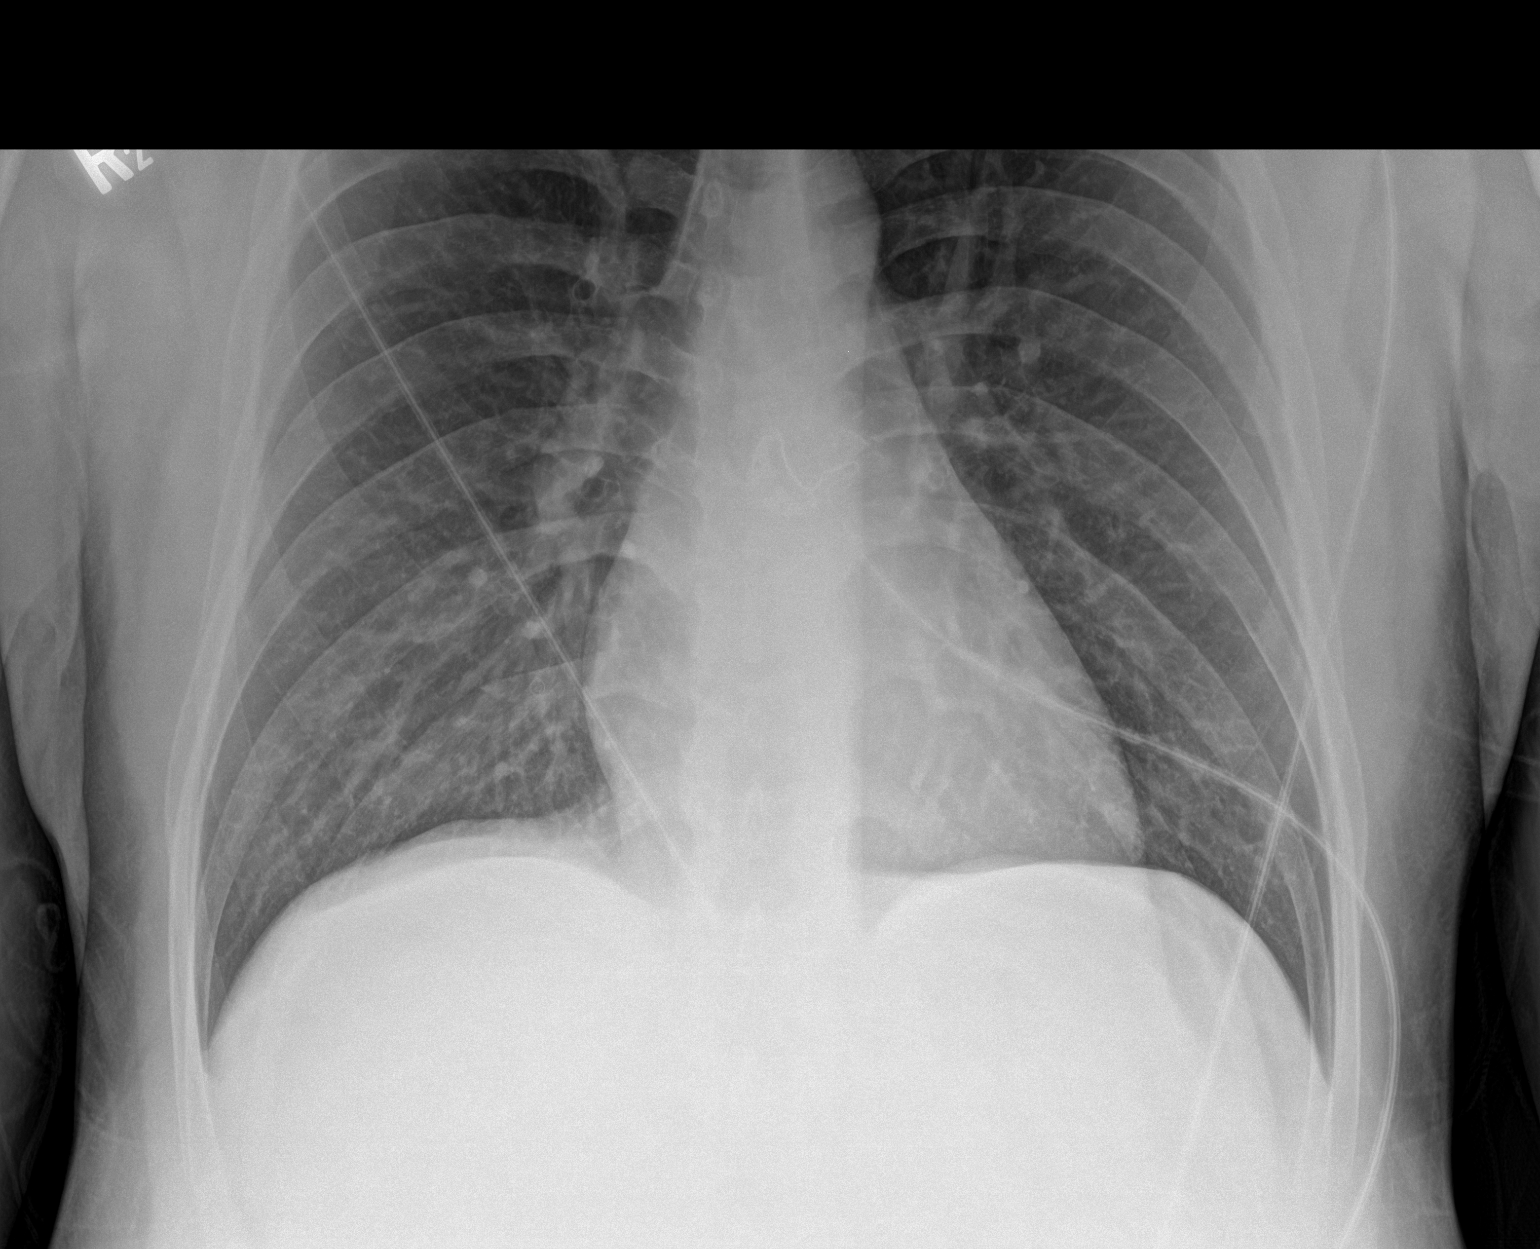

[2 of 2 positions shown; findings below may reference images not displayed]

FINDINGS: Lungs are clear. Heart size and pulmonary vascularity are normal. No
adenopathy. No bone lesions.
IMPRESSION: No abnormality noted.

## 2021-12-26 ENCOUNTER — Emergency Department: Payer: Self-pay

## 2021-12-26 ENCOUNTER — Other Ambulatory Visit: Payer: Self-pay

## 2021-12-26 ENCOUNTER — Emergency Department
Admission: EM | Admit: 2021-12-26 | Discharge: 2021-12-27 | Disposition: A | Discharge status: Home / Self Care | Payer: Self-pay | Attending: Emergency Medicine | Admitting: Emergency Medicine

## 2021-12-26 DIAGNOSIS — W25XXXA Contact with sharp glass, initial encounter: Secondary | ICD-10-CM | POA: Insufficient documentation

## 2021-12-26 DIAGNOSIS — S61214A Laceration without foreign body of right ring finger without damage to nail, initial encounter: Secondary | ICD-10-CM | POA: Insufficient documentation

## 2021-12-26 DIAGNOSIS — S61212A Laceration without foreign body of right middle finger without damage to nail, initial encounter: Secondary | ICD-10-CM | POA: Insufficient documentation

## 2021-12-26 DIAGNOSIS — S60221A Contusion of right hand, initial encounter: Secondary | ICD-10-CM

## 2021-12-26 DIAGNOSIS — S61210A Laceration without foreign body of right index finger without damage to nail, initial encounter: Secondary | ICD-10-CM | POA: Insufficient documentation

## 2021-12-26 MED ORDER — LIDOCAINE-EPINEPHRINE-TETRACAINE (LET) SOLUTION: 3.0000 mL | Freq: Once | NASAL | Status: DC

## 2021-12-26 MED ORDER — TETANUS-DIPHTH-ACELL PERTUSSIS 5-2.5-18.5 LF-MCG/0.5 IM SUSY
0.5000 mL | PREFILLED_SYRINGE | Freq: Once | INTRAMUSCULAR | Status: DC
  Filled 2021-12-26 (×2): qty 0.5

## 2021-12-26 MED ORDER — LIDOCAINE-EPINEPHRINE-TETRACAINE (LET) TOPICAL GEL
3.0000 mL | Freq: Once | TOPICAL | Status: AC
  Administered 2021-12-26: 3 mL via TOPICAL
  Filled 2021-12-26: qty 3
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
## 2021-12-27 MED ORDER — LIDOCAINE HCL (PF) 1 % IJ SOLN
5.0000 mL | Freq: Once | INTRAMUSCULAR | Status: DC
  Filled 2021-12-27: qty 5

## 2021-12-27 NOTE — ED Notes (Signed)
E signature pad not working. Pt educated on discharge instructions and verbalized understanding.  

## 2021-12-27 NOTE — Discharge Instructions (Addendum)
Keep the wounds clean, dry, and covered.  See your provider or local urgent care for suture removal in 7 to 10 days.

## 2022-06-26 ENCOUNTER — Emergency Department
Admission: EM | Admit: 2022-06-26 | Discharge: 2022-06-26 | Disposition: A | Discharge status: Home / Self Care | Payer: Self-pay | Attending: Emergency Medicine | Admitting: Emergency Medicine

## 2022-06-26 ENCOUNTER — Other Ambulatory Visit: Payer: Self-pay

## 2022-06-26 ENCOUNTER — Emergency Department: Payer: Self-pay

## 2022-06-26 ENCOUNTER — Encounter: Payer: Self-pay | Admitting: Emergency Medicine

## 2022-06-26 DIAGNOSIS — Z20822 Contact with and (suspected) exposure to covid-19: Secondary | ICD-10-CM | POA: Insufficient documentation

## 2022-06-26 DIAGNOSIS — J45909 Unspecified asthma, uncomplicated: Secondary | ICD-10-CM | POA: Insufficient documentation

## 2022-06-26 DIAGNOSIS — R55 Syncope and collapse: Secondary | ICD-10-CM

## 2022-06-26 DIAGNOSIS — R1032 Left lower quadrant pain: Secondary | ICD-10-CM | POA: Insufficient documentation

## 2022-06-26 DIAGNOSIS — R1031 Right lower quadrant pain: Secondary | ICD-10-CM | POA: Insufficient documentation

## 2022-06-26 DIAGNOSIS — R197 Diarrhea, unspecified: Secondary | ICD-10-CM | POA: Insufficient documentation

## 2022-06-26 DIAGNOSIS — R112 Nausea with vomiting, unspecified: Secondary | ICD-10-CM | POA: Insufficient documentation

## 2022-06-26 DIAGNOSIS — R0789 Other chest pain: Secondary | ICD-10-CM | POA: Insufficient documentation

## 2022-06-26 LAB — COMPREHENSIVE METABOLIC PANEL
ALT: 56 U/L — ABNORMAL HIGH (ref 0–44)
AST: 42 U/L — ABNORMAL HIGH (ref 15–41)
Albumin: 4.4 g/dL (ref 3.5–5.0)
Alkaline Phosphatase: 64 U/L (ref 38–126)
Anion gap: 13 (ref 5–15)
BUN: 14 mg/dL (ref 6–20)
CO2: 23 mmol/L (ref 22–32)
Calcium: 9.9 mg/dL (ref 8.9–10.3)
Chloride: 100 mmol/L (ref 98–111)
Creatinine, Ser: 0.85 mg/dL (ref 0.61–1.24)
GFR, Estimated: >60 mL/min (ref >60)
Glucose, Bld: 154 mg/dL — ABNORMAL HIGH (ref 70–99)
Potassium: 4.9 mmol/L (ref 3.5–5.1)
Sodium: 136 mmol/L (ref 135–145)
Total Bilirubin: 1 mg/dL (ref 0.3–1.2)
Total Protein: 9.1 g/dL — ABNORMAL HIGH (ref 6.5–8.1)

## 2022-06-26 LAB — URINALYSIS, ROUTINE W REFLEX MICROSCOPIC
Bacteria, UA: NONE SEEN
Bilirubin Urine: NEGATIVE
Glucose, UA: NEGATIVE mg/dL
Hgb urine dipstick: NEGATIVE
Ketones, ur: NEGATIVE mg/dL
Nitrite: NEGATIVE
Protein, ur: NEGATIVE mg/dL
Specific Gravity, Urine: 1.024 (ref 1.005–1.030)
Squamous Epithelial / HPF: NONE SEEN /HPF (ref 0–5)
pH: 6 (ref 5.0–8.0)

## 2022-06-26 LAB — CBC WITH DIFFERENTIAL/PLATELET
Abs Immature Granulocytes: 0.06 10*3/uL (ref 0.00–0.07)
Basophils Absolute: 0 10*3/uL (ref 0.0–0.1)
Basophils Relative: 0 %
Eosinophils Absolute: 0.1 10*3/uL (ref 0.0–0.5)
Eosinophils Relative: 1 %
HCT: 47.4 % (ref 39.0–52.0)
Hemoglobin: 16 g/dL (ref 13.0–17.0)
Immature Granulocytes: 0 %
Lymphocytes Relative: 5 %
Lymphs Abs: 0.8 10*3/uL (ref 0.7–4.0)
MCH: 28.6 pg (ref 26.0–34.0)
MCHC: 33.8 g/dL (ref 30.0–36.0)
MCV: 84.8 fL (ref 80.0–100.0)
Monocytes Absolute: 0.6 10*3/uL (ref 0.1–1.0)
Monocytes Relative: 4 %
Neutro Abs: 13.6 10*3/uL — ABNORMAL HIGH (ref 1.7–7.7)
Neutrophils Relative %: 90 %
Platelets: 290 10*3/uL (ref 150–400)
RBC: 5.59 MIL/uL (ref 4.22–5.81)
RDW: 12.7 % (ref 11.5–15.5)
WBC: 15.2 10*3/uL — ABNORMAL HIGH (ref 4.0–10.5)
nRBC: 0 % (ref 0.0–0.2)

## 2022-06-26 LAB — RESP PANEL BY RT-PCR (RSV, FLU A&B, COVID)  RVPGX2
Influenza A by PCR: NEGATIVE
Influenza B by PCR: NEGATIVE
Resp Syncytial Virus by PCR: NEGATIVE
SARS Coronavirus 2 by RT PCR: NEGATIVE

## 2022-06-26 LAB — LIPASE, BLOOD: Lipase: 34 U/L (ref 11–51)

## 2022-06-26 LAB — TROPONIN I (HIGH SENSITIVITY)
Troponin I (High Sensitivity): <2 ng/L (ref <18)
Troponin I (High Sensitivity): <2 ng/L (ref <18)

## 2022-06-26 MED ORDER — ACETAMINOPHEN 500 MG PO TABS
1000.0000 mg | ORAL_TABLET | Freq: Once | ORAL | Status: AC
  Administered 2022-06-26: 1000 mg via ORAL
  Filled 2022-06-26: qty 2

## 2022-06-26 MED ORDER — MORPHINE SULFATE (PF) 4 MG/ML IV SOLN
4.0000 mg | Freq: Once | INTRAVENOUS | Status: AC
  Administered 2022-06-26: 4 mg via INTRAVENOUS
  Filled 2022-06-26: qty 1

## 2022-06-26 MED ORDER — IBUPROFEN 400 MG PO TABS
400.0000 mg | ORAL_TABLET | Freq: Once | ORAL | Status: AC
  Administered 2022-06-26: 400 mg via ORAL
  Filled 2022-06-26: qty 1

## 2022-06-26 MED ORDER — DICYCLOMINE HCL 10 MG PO CAPS
10.0000 mg | ORAL_CAPSULE | Freq: Once | ORAL | Status: AC
  Administered 2022-06-26: 10 mg via ORAL
  Filled 2022-06-26: qty 1

## 2022-06-26 MED ORDER — LACTATED RINGERS IV BOLUS
1000.0000 mL | Freq: Once | INTRAVENOUS | Status: AC
  Administered 2022-06-26: 1000 mL via INTRAVENOUS

## 2022-06-26 MED ORDER — DICYCLOMINE HCL 10 MG PO CAPS: 10.0000 mg | ORAL_CAPSULE | Freq: Three times a day (TID) | ORAL | 0 refills | Status: DC | PRN

## 2022-06-26 MED ORDER — ONDANSETRON HCL 4 MG/2ML IJ SOLN
4.0000 mg | Freq: Once | INTRAMUSCULAR | Status: AC
  Administered 2022-06-26: 4 mg via INTRAVENOUS
  Filled 2022-06-26: qty 2

## 2022-06-26 MED ORDER — ONDANSETRON 8 MG PO TBDP: 8.0000 mg | ORAL_TABLET | Freq: Three times a day (TID) | ORAL | 0 refills | Status: DC | PRN

## 2022-06-26 NOTE — ED Provider Notes (Addendum)
Patient was just about to be discharged by provider before me when he apparently was presyncopal and getting into the car.  He was transported back to room where I evaluated him and he was diaphoretic and endorsing lightheadedness.  Patient however conscious and was laid back in the stretcher.  He has had some vomiting diarrhea for about a day about 10 episodes total.  Did not yet receive fluids in the ED suspect this was likely vasovagal versus orthostatic in the setting of volume depletion.  Will give a bolus of fluid Tylenol Motrin and reassess.  Did repeat his EKG as he was still complaining of some chest pain and this is unchanged.  He had serial troponins that have been negative.  On repeat assessment patient feeling improved after second bolus.  He was able to walk.  Complain of some ongoing lightheadedness but was not presyncopal.  At this point I think he can safely be discharged.   Rada Hay, MD 06/26/22 Tobey Bride, MD 06/26/22 Carollee Massed

## 2022-06-26 NOTE — ED Provider Notes (Signed)
[facility] Provider Note    Event Date/Time   First MD Initiated Contact with Patient 06/26/22 1005     (approximate)   History   Emesis   HPI  Dennis Hayes is a 37 y.o. male with a history of asthma who presents with nausea and vomiting since early this morning, associated with diarrhea as well as some crampy lower abdominal pain.  He also reports feeling hot and sweaty intermittently as well as generalized weakness and lightheadedness.  The patient denies any cough, shortness of breath, or chest pain.  He has had no fevers.   I reviewed the past medical records.  The patient has several prior ED visits over the last 2 years for unrelated symptoms.  He has no recent hospitalizations but was evaluated by Dr. Weber Cooks from psychiatry on 08/05/2020 for depression after he initially presented with chest pain.   Physical Exam   Triage Vital Signs: ED Triage Vitals  Enc Vitals Group     BP 06/26/22 1011 127/83     Pulse Rate 06/26/22 1007 84     Resp 06/26/22 1007 13     Temp 06/26/22 1007 97.9 F (36.6 C)     Temp Source 06/26/22 1007 Oral     SpO2 06/26/22 1007 100 %     Weight 06/26/22 1009 243 lb (110.2 kg)     Height 06/26/22 1009 5\' 10"  (1.778 m)     Head Circumference --      Peak Flow --      Pain Score 06/26/22 1009 7     Pain Loc --      Pain Edu? --      Excl. in [place]? --     Most recent vital signs: Vitals:   06/26/22 1430 06/26/22 1500  BP: 130/78 135/85  Pulse: 88 97  Resp: (!) 26 (!) 21  Temp:    SpO2: 100% 98%     General: Awake, no distress.  CV:  Good peripheral perfusion.  Normal heart sounds. Resp:  Normal effort.  Lungs CTAB. Abd:  Mild bilateral lower quadrant tenderness with no peritoneal signs.  No distention.  Other:  No peripheral edema.   ED Results / Procedures / Treatments   Labs (all labs ordered are listed, but only abnormal results are displayed) Labs Reviewed  COMPREHENSIVE METABOLIC PANEL - Abnormal;  Notable for the following components:      Result Value   Glucose, Bld 154 (*)    Total Protein 9.1 (*)    AST 42 (*)    ALT 56 (*)    All other components within normal limits  CBC WITH DIFFERENTIAL/PLATELET - Abnormal; Notable for the following components:   WBC 15.2 (*)    Neutro Abs 13.6 (*)    All other components within normal limits  URINALYSIS, ROUTINE W REFLEX MICROSCOPIC - Abnormal; Notable for the following components:   Color, Urine YELLOW (*)    APPearance CLEAR (*)    Leukocytes,Ua TRACE (*)    All other components within normal limits  RESP PANEL BY RT-PCR (RSV, FLU A&B, COVID)  RVPGX2  LIPASE, BLOOD  TROPONIN I (HIGH SENSITIVITY)  TROPONIN I (HIGH SENSITIVITY)     EKG  ED ECG REPORT I, Arta Silence, the attending physician, personally viewed and interpreted this ECG.  Date: 06/26/2022 EKG Time: 1006 Rate: 85 Rhythm: normal sinus rhythm QRS Axis: normal Intervals: normal ST/T Wave abnormalities: Nonspecific ST abnormalities diffusely Narrative Interpretation: Nonspecific ST abnormalities  new when compared to EKG of 08/05/2020 with, indeterminate for acute ischemia  ED ECG REPORT I, Arta Silence, the attending physician, personally viewed and interpreted this ECG.  Date: 06/26/2022 EKG Time: 1040 Rate: 87 Rhythm: normal sinus rhythm QRS Axis: normal Intervals: normal ST/T Wave abnormalities: Nonspecific ST abnormalities Narrative Interpretation: Nonspecific ST abnormalities with no dynamic change when compared to EKG of 1006 today    RADIOLOGY  Chest x-ray: I independently viewed and interpreted the images; no focal consolidation or edema  US abdomen RUQ: No acute abnormality  PROCEDURES:  Critical Care performed: Yes, see critical care procedure note(s)  .Critical Care  Performed by: Arta Silence, MD Authorized by: Arta Silence, MD   Critical care provider statement:    Critical care time (minutes):  15    Critical care was necessary to treat or prevent imminent or life-threatening deterioration of the following conditions:  Cardiac failure   Critical care was time spent personally by me on the following activities:  Development of treatment plan with patient or surrogate, discussions with consultants, evaluation of patient's response to treatment, examination of patient, ordering and review of laboratory studies, ordering and review of radiographic studies, ordering and performing treatments and interventions, pulse oximetry, re-evaluation of patient's condition, review of old charts and obtaining history from patient or surrogate    MEDICATIONS ORDERED IN ED: Medications  acetaminophen (TYLENOL) tablet 1,000 mg (has no administration in time range)  ibuprofen (ADVIL) tablet 400 mg (has no administration in time range)  ondansetron (ZOFRAN) injection 4 mg (4 mg Intravenous Given 06/26/22 1013)  dicyclomine (BENTYL) capsule 10 mg (10 mg Oral Given 06/26/22 1014)  morphine (PF) 4 MG/ML injection 4 mg (4 mg Intravenous Given 06/26/22 1046)  lactated ringers bolus 1,000 mL (1,000 mLs Intravenous New Bag/Given 06/26/22 1559)     IMPRESSION / MDM / [place] / ED COURSE  I reviewed the triage vital signs and the nursing notes.  37 year old male with PMH as noted above presents with nausea, vomiting, diarrhea as well as episodes of generalized weakness and feeling sweaty and lightheaded since early this morning.  He has not had any chest pain.  EMS EKG revealed nonspecific ST abnormalities which were concerning for STEMI and so code STEMI was activated.  Physical exam is unremarkable for acute findings.  EKG here shows nonspecific anterior lateral ST abnormalities which are new when compared to EKG from 2022 but do not meet STEMI criteria.  Differential diagnosis includes, but is not limited to, gastroenteritis, COVID, other acute viral syndrome, dehydration, electrolyte abnormality, less likely  ACS.  Dr. Fletcher Anon from reviewed EKGs and canceled code STEMI.  Patient's presentation is most consistent with acute presentation with potential threat to life or bodily function.  The patient is on the cardiac monitor to evaluate for evidence of arrhythmia and/or significant heart rate changes.  ----------------------------------------- 10:43 AM on 06/26/2022 -----------------------------------------  The patient is now having some chest pain rating down the left arm.  Repeat EKG shows no changes.  ----------------------------------------- 3:59 PM on 06/26/2022 -----------------------------------------  Few hours ago the patient started develop some right upper quadrant abdominal pain so I obtained an ultrasound which is negative.  Lab workup is unremarkable with negative troponins x 2, minimally elevated LFTs, mild leukocytosis consistent with viral infection, and normal electrolytes.  Respiratory panel is negative.  There is no evidence of any acute cardiac process.  On reassessment the patient he stated he was feeling well.  His vital signs were stable and  he was feeling well enough to go home.  I counseled him on the results of the workup and plan of care.  However subsequently when he got up to be discharged, he became orthostatic and presyncopal.  He is receiving fluids.  I have signed him out to the oncoming ED physician Dr. Starleen Blue.   FINAL CLINICAL IMPRESSION(S) / ED DIAGNOSES   Final diagnoses:  Nausea vomiting and diarrhea  Atypical chest pain     Rx / DC Orders   ED Discharge Orders          Ordered    ondansetron (ZOFRAN-ODT) 8 MG disintegrating tablet  Every 8 hours PRN        06/26/22 1502    dicyclomine (BENTYL) 10 MG capsule  Every 8 hours PRN        06/26/22 1502    Ambulatory referral to Cardiology       Comments: If you have not heard from the Cardiology office within the next 72 hours please call [phone].   06/26/22 1503             Note:   This document was prepared using Dragon voice recognition software and may include unintentional dictation errors.    Arta Silence, MD 06/26/22 [phone]

## 2022-06-26 NOTE — ED Triage Notes (Signed)
Pt to ED via [facility] from home for nausea, vomiting, and weakness that started around 0100. Pt also reports that he is having lower abdominal pain and intermittent numbness in his hands. EKG with EMS showed ST elevation meeting STEMI criteria. Pt denies having chest pain or shortness of breath. No known cardiac history. EMS gave pt Zofran 4 mg and Aspirin 324 mg in route.

## 2022-06-26 NOTE — Discharge Instructions (Addendum)
Take the Zofran as needed for nausea and the Bentyl as needed for abdominal pain or cramping.  Follow-up with your regular primary care provider.  We have also provided cardiology referral.  Return to the ER for new, worsening, or persistent severe chest or abdominal pain, vomiting, fever, diarrhea, difficulty breathing, or any other new or worsening symptoms that concern you.

## 2022-08-20 ENCOUNTER — Other Ambulatory Visit: Payer: Self-pay

## 2022-08-20 ENCOUNTER — Emergency Department
Admission: EM | Admit: 2022-08-20 | Discharge: 2022-08-20 | Disposition: A | Discharge status: Home / Self Care | Payer: Self-pay | Attending: Emergency Medicine | Admitting: Emergency Medicine

## 2022-08-20 DIAGNOSIS — Z1152 Encounter for screening for COVID-19: Secondary | ICD-10-CM | POA: Insufficient documentation

## 2022-08-20 DIAGNOSIS — J020 Streptococcal pharyngitis: Secondary | ICD-10-CM | POA: Insufficient documentation

## 2022-08-20 LAB — GROUP A STREP BY PCR: Group A Strep by PCR: DETECTED — AB

## 2022-08-20 LAB — SARS CORONAVIRUS 2 BY RT PCR: SARS Coronavirus 2 by RT PCR: NEGATIVE

## 2022-08-20 MED ORDER — AMOXICILLIN 875 MG PO TABS: 875.0000 mg | ORAL_TABLET | Freq: Two times a day (BID) | ORAL | 0 refills | Status: AC

## 2022-08-20 MED ORDER — DEXAMETHASONE 10 MG/ML FOR PEDIATRIC ORAL USE
10.0000 mg | Freq: Once | INTRAMUSCULAR | Status: AC
  Administered 2022-08-20: 10 mg via ORAL
  Filled 2022-08-20: qty 1

## 2022-08-20 NOTE — ED Triage Notes (Signed)
Pt presents to ER with c/o sore throat x2 days.  Pt reports slight cough and HA as well.  Denies any fever, or other symptoms.  States it has been painful to swallow.  Denies any sick contacts.  Pt is otherwise A&O x4 and in NAD.

## 2022-08-20 NOTE — ED Provider Notes (Signed)
[facility] Provider Note    None    (approximate)   History   Chief Complaint Sore Throat   HPI  Dennis Hayes is a 36 y.o. male with no significant past medical history who presents to the ED complaining of sore throat.  Patient reports that he has had 2 days of gradually worsening pain in both sides of his throat, particularly when swallowing.  He has continued to be able to eat and drink normally, but states it is painful to do so.  He denies any fever, cough, or difficulty breathing.  He has not taken anything for pain prior to arrival, is not aware of any sick contacts.     Physical Exam   Triage Vital Signs: ED Triage Vitals  Enc Vitals Group     BP 08/20/22 0500 127/78     Pulse Rate 08/20/22 0500 96     Resp 08/20/22 0500 15     Temp 08/20/22 0500 98.5 F (36.9 C)     Temp Source 08/20/22 0500 Oral     SpO2 08/20/22 0500 96 %     Weight 08/20/22 0501 245 lb (111.1 kg)     Height --      Head Circumference --      Peak Flow --      Pain Score 08/20/22 0501 8     Pain Loc --      Pain Edu? --      Excl. in [place]? --     Most recent vital signs: Vitals:   08/20/22 0500  BP: 127/78  Pulse: 96  Resp: 15  Temp: 98.5 F (36.9 C)  SpO2: 96%    Constitutional: Alert and oriented. Eyes: Conjunctivae are normal. Head: Atraumatic. Nose: No congestion/rhinnorhea. Mouth/Throat: Mucous membranes are moist.  Tonsillar erythema and edema bilaterally with overlying exudates, no uvular deviation. Cardiovascular: Normal rate, regular rhythm. Grossly normal heart sounds.  2+ radial pulses bilaterally. Respiratory: Normal respiratory effort.  No retractions. Lungs CTAB. Gastrointestinal: Soft and nontender. No distention. Musculoskeletal: No lower extremity tenderness nor edema.  Neurologic:  Normal speech and language. No gross focal neurologic deficits are appreciated.    ED Results / Procedures / Treatments   Labs (all labs ordered are  listed, but only abnormal results are displayed) Labs Reviewed  GROUP A STREP BY PCR - Abnormal; Notable for the following components:      Result Value   Group A Strep by PCR DETECTED (*)    All other components within normal limits  SARS CORONAVIRUS 2 BY RT PCR    PROCEDURES:  Critical Care performed: No  Procedures   MEDICATIONS ORDERED IN ED: Medications  dexamethasone (DECADRON) 10 MG/ML injection for Pediatric ORAL use 10 mg (has no administration in time range)     IMPRESSION / MDM / ASSESSMENT AND PLAN / ED COURSE  I reviewed the triage vital signs and the nursing notes.                              37 y.o. male with no significant past medical history who presents to the ED with 2 days of increasing sore throat.  Patient's presentation is most consistent with acute complicated illness / injury requiring diagnostic workup.  Differential diagnosis includes, but is not limited to, strep pharyngitis, viral pharyngitis, PTA, COVID-19, other viral syndrome.  Patient nontoxic-appearing and in no acute distress, vital signs are unremarkable.  He has tonsillar erythema and edema on exam with overlying exudates, but no uvular deviation or other sign concerning for peritonsillar abscess.  COVID-19 testing is negative, patient did test positive for strep pharyngitis.  We will treat with oral dose of Decadron, patient appropriate for outpatient management with amoxicillin.  He was counseled to follow-up with his PCP and to return to the ED for new or worsening symptoms, patient agrees with plan.      FINAL CLINICAL IMPRESSION(S) / ED DIAGNOSES   Final diagnoses:  Strep pharyngitis     Rx / DC Orders   ED Discharge Orders          Ordered    amoxicillin (AMOXIL) 875 MG tablet  2 times daily        08/20/22 9562             Note:  This document was prepared using Dragon voice recognition software and may include unintentional dictation errors.   Chesley Noon, MD 08/20/22 [phone]

## 2022-08-20 NOTE — ED Notes (Signed)
Pt given discharge instructions and return precautions at this time.  Pt verbalizes understanding of instructions.  Pt alert, in NAD and ambulatory to lobby at this time.

## 2022-09-04 ENCOUNTER — Other Ambulatory Visit: Payer: Self-pay

## 2022-10-13 ENCOUNTER — Emergency Department: Payer: Self-pay

## 2022-10-13 ENCOUNTER — Other Ambulatory Visit: Payer: Self-pay

## 2022-10-13 ENCOUNTER — Emergency Department
Admission: EM | Admit: 2022-10-13 | Discharge: 2022-10-13 | Disposition: A | Discharge status: Home / Self Care | Payer: Self-pay | Attending: Emergency Medicine | Admitting: Emergency Medicine

## 2022-10-13 ENCOUNTER — Encounter: Payer: Self-pay | Admitting: Emergency Medicine

## 2022-10-13 DIAGNOSIS — R519 Headache, unspecified: Secondary | ICD-10-CM | POA: Diagnosis not present

## 2022-10-13 DIAGNOSIS — Y92410 Unspecified street and highway as the place of occurrence of the external cause: Secondary | ICD-10-CM | POA: Diagnosis not present

## 2022-10-13 DIAGNOSIS — M542 Cervicalgia: Secondary | ICD-10-CM | POA: Insufficient documentation

## 2022-10-13 MED ORDER — MELOXICAM 15 MG PO TABS: 15.0000 mg | ORAL_TABLET | Freq: Every day | ORAL | 1 refills | Status: AC

## 2022-10-13 MED ORDER — METHOCARBAMOL 500 MG PO TABS: 500.0000 mg | ORAL_TABLET | Freq: Three times a day (TID) | ORAL | 0 refills | Status: AC | PRN
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
## 2022-10-13 NOTE — Discharge Instructions (Signed)
Take Meloxicam and Robaxin as directed.  

## 2022-12-08 ENCOUNTER — Other Ambulatory Visit: Payer: Self-pay

## 2022-12-08 ENCOUNTER — Emergency Department
Admission: EM | Admit: 2022-12-08 | Discharge: 2022-12-08 | Disposition: A | Discharge status: Home / Self Care | Payer: Self-pay | Attending: Emergency Medicine | Admitting: Emergency Medicine

## 2022-12-08 DIAGNOSIS — U071 COVID-19: Secondary | ICD-10-CM | POA: Insufficient documentation

## 2022-12-08 LAB — SARS CORONAVIRUS 2 BY RT PCR: SARS Coronavirus 2 by RT PCR: POSITIVE — AB

## 2022-12-08 MED ORDER — ONDANSETRON 4 MG PO TBDP: 4.0000 mg | ORAL_TABLET | Freq: Three times a day (TID) | ORAL | 0 refills | Status: DC | PRN

## 2022-12-08 NOTE — ED Triage Notes (Signed)
Pt comes iwht c/o fever, vomiting cold chills cough since this am.,

## 2022-12-08 NOTE — ED Provider Notes (Signed)
   [facility] Provider Note    Event Date/Time   First MD Initiated Contact with Patient 12/08/22 1941     (approximate)   History   Fever   HPI  Dennis Hayes is a 37 y.o. male who presents with complaints of headache, fatigue, chills, myalgias over the last 24 hours.  No shortness of breath     Physical Exam   Triage Vital Signs: ED Triage Vitals  Encounter Vitals Group     BP 12/08/22 1832 136/87     Systolic BP Percentile --      Diastolic BP Percentile --      Pulse Rate 12/08/22 1832 91     Resp 12/08/22 1832 18     Temp 12/08/22 1832 99.2 F (37.3 C)     Temp Source 12/08/22 1832 Oral     SpO2 12/08/22 1832 99 %     Weight 12/08/22 1832 91.2 kg (201 lb)     Height 12/08/22 1832 1.778 m (5\' 10" )     Head Circumference --      Peak Flow --      Pain Score 12/08/22 1835 10     Pain Loc --      Pain Education --      Exclude from Growth Chart --     Most recent vital signs: Vitals:   12/08/22 1832  BP: 136/87  Pulse: 91  Resp: 18  Temp: 99.2 F (37.3 C)  SpO2: 99%     General: Awake, no distress.  CV:  Good peripheral perfusion.  Resp:  Normal effort.  Abd:  No distention.  Other:     ED Results / Procedures / Treatments   Labs (all labs ordered are listed, but only abnormal results are displayed) Labs Reviewed  SARS CORONAVIRUS 2 BY RT PCR - Abnormal; Notable for the following components:      Result Value   SARS Coronavirus 2 by RT PCR POSITIVE (*)    All other components within normal limits     EKG     RADIOLOGY     PROCEDURES:  Critical Care performed:   Procedures   MEDICATIONS ORDERED IN ED: Medications - No data to display   IMPRESSION / MDM / ASSESSMENT AND PLAN / ED COURSE  I reviewed the triage vital signs and the nursing notes. Patient's presentation is most consistent with acute complicated illness / injury requiring diagnostic workup.  Patient presents with likely viral upper  respiratory infection, suspicious for COVID given prevalence in the community this time.  Bovid PCR is positive, recommend supportive care, outpatient follow-up as needed.        FINAL CLINICAL IMPRESSION(S) / ED DIAGNOSES   Final diagnoses:  COVID-19     Rx / DC Orders   ED Discharge Orders          Ordered    ondansetron (ZOFRAN-ODT) 4 MG disintegrating tablet  Every 8 hours PRN        12/08/22 1946             Note:  This document was prepared using Dragon voice recognition software and may include unintentional dictation errors.   Jene Every, MD 12/08/22 2322

## 2022-12-11 NOTE — Group Note (Deleted)

## 2023-04-27 ENCOUNTER — Emergency Department
Admission: EM | Admit: 2023-04-27 | Discharge: 2023-04-27 | Disposition: A | Discharge status: Home / Self Care | Payer: Self-pay | Attending: Emergency Medicine | Admitting: Emergency Medicine

## 2023-04-27 ENCOUNTER — Other Ambulatory Visit: Payer: Self-pay

## 2023-04-27 ENCOUNTER — Emergency Department: Payer: Self-pay

## 2023-04-27 DIAGNOSIS — R079 Chest pain, unspecified: Secondary | ICD-10-CM | POA: Insufficient documentation

## 2023-04-27 DIAGNOSIS — R519 Headache, unspecified: Secondary | ICD-10-CM | POA: Insufficient documentation

## 2023-04-27 LAB — BASIC METABOLIC PANEL
Anion gap: 10 (ref 5–15)
BUN: 8 mg/dL (ref 6–20)
CO2: 26 mmol/L (ref 22–32)
Calcium: 9.4 mg/dL (ref 8.9–10.3)
Chloride: 101 mmol/L (ref 98–111)
Creatinine, Ser: 1.01 mg/dL (ref 0.61–1.24)
GFR, Estimated: >60 mL/min (ref >60)
Glucose, Bld: 109 mg/dL — ABNORMAL HIGH (ref 70–99)
Potassium: 3.8 mmol/L (ref 3.5–5.1)
Sodium: 137 mmol/L (ref 135–145)

## 2023-04-27 LAB — CBC
HCT: 46.7 % (ref 39.0–52.0)
Hemoglobin: 15.6 g/dL (ref 13.0–17.0)
MCH: 28.4 pg (ref 26.0–34.0)
MCHC: 33.4 g/dL (ref 30.0–36.0)
MCV: 84.9 fL (ref 80.0–100.0)
Platelets: 310 10*3/uL (ref 150–400)
RBC: 5.5 MIL/uL (ref 4.22–5.81)
RDW: 12.4 % (ref 11.5–15.5)
WBC: 8.1 10*3/uL (ref 4.0–10.5)
nRBC: 0 % (ref 0.0–0.2)

## 2023-04-27 LAB — TROPONIN I (HIGH SENSITIVITY): Troponin I (High Sensitivity): 3 ng/L (ref <18)

## 2023-04-27 MED ORDER — PROCHLORPERAZINE EDISYLATE 10 MG/2ML IJ SOLN
10.0000 mg | Freq: Once | INTRAMUSCULAR | Status: AC
  Administered 2023-04-27: 10 mg via INTRAVENOUS
  Filled 2023-04-27: qty 2

## 2023-04-27 MED ORDER — SODIUM CHLORIDE 0.9 % IV BOLUS
1000.0000 mL | Freq: Once | INTRAVENOUS | Status: AC
  Administered 2023-04-27: 1000 mL via INTRAVENOUS

## 2023-04-27 MED ORDER — KETOROLAC TROMETHAMINE 15 MG/ML IJ SOLN
15.0000 mg | Freq: Once | INTRAMUSCULAR | Status: AC
  Administered 2023-04-27: 15 mg via INTRAVENOUS
  Filled 2023-04-27: qty 1

## 2023-04-27 MED ORDER — DIPHENHYDRAMINE HCL 50 MG/ML IJ SOLN
25.0000 mg | Freq: Once | INTRAMUSCULAR | Status: AC
  Administered 2023-04-27: 25 mg via INTRAVENOUS
  Filled 2023-04-27: qty 1

## 2023-04-27 NOTE — Discharge Instructions (Signed)
You were seen in the ER today for your headache and chest pain.  Your testing was fortunately reassuring.  Follow-up with a primary care doctor for further evaluation.  Return to the ER for new or worsening symptoms.

## 2023-04-27 NOTE — ED Provider Notes (Signed)
[facility] Provider Note    Event Date/Time   First MD Initiated Contact with Patient 04/27/23 1326     (approximate)   History   Chest Pain and Headache   HPI  Dennis Hayes is a 38 year old male presenting with department for evaluation of chest pain and headache.  Patient reports that for the last week he has had ongoing episodes of chest pain described as a tightness over the left side of his chest.  Not specifically pleuritic or exertional.  No associated shortness of breath.  Denies any recent heavy lifting or trauma.  In addition, he reports he has been having an ongoing headache.  Described as primarily over his right side.  Does have associated light sensitivity.  Has had some decreased p.o. intake during this time.  No vomiting.  No fevers or chills.  No numbness, tingling, focal weakness.  Headache was not sudden in onset.  No associated vision changes.   Physical Exam   Triage Vital Signs: ED Triage Vitals  Encounter Vitals Group     BP 04/27/23 1117 125/79     Systolic BP Percentile --      Diastolic BP Percentile --      Pulse Rate 04/27/23 1117 76     Resp 04/27/23 1117 18     Temp 04/27/23 1117 98 F (36.7 C)     Temp src --      SpO2 04/27/23 1117 97 %     Weight 04/27/23 1115 240 lb (108.9 kg)     Height 04/27/23 1115 5\' 10"  (1.778 m)     Head Circumference --      Peak Flow --      Pain Score 04/27/23 1115 5     Pain Loc --      Pain Education --      Exclude from Growth Chart --     Most recent vital signs: Vitals:   04/27/23 1117  BP: 125/79  Pulse: 76  Resp: 18  Temp: 98 F (36.7 C)  SpO2: 97%     General: Awake, interactive  CV:  Regular rate, good peripheral perfusion.  Resp:  Unlabored respirations, lungs clear to auscultation chest Chest wall: No significant tenderness to palpation Abd:  Nondistended, soft, nontender Neuro:  Alert and oriented, normal extraocular movements, pupils equal and reactive,  symmetric facial movement, sensation intact over bilateral upper and lower extremities with 5 out of 5 strength.  Normal finger-to-nose testing.   ED Results / Procedures / Treatments   Labs (all labs ordered are listed, but only abnormal results are displayed) Labs Reviewed  BASIC METABOLIC PANEL - Abnormal; Notable for the following components:      Result Value   Glucose, Bld 109 (*)    All other components within normal limits  CBC  TROPONIN I (HIGH SENSITIVITY)     EKG EKG independently reviewed interpreted by myself (ER attending) demonstrates:  EKG demonstrates normal sinus rhythm at a rate of 77, PR 154, QRS 86, QTc 4 7, no acute ST changes  RADIOLOGY Imaging independently reviewed and interpreted by myself demonstrates:  CXR without focal consolidation  PROCEDURES:  Critical Care performed: No  Procedures   MEDICATIONS ORDERED IN ED: Medications  sodium chloride 0.9 % bolus 1,000 mL (1,000 mLs Intravenous New Bag/Given 04/27/23 1432)  ketorolac (TORADOL) 15 MG/ML injection 15 mg (15 mg Intravenous Given 04/27/23 1432)  diphenhydrAMINE (BENADRYL) injection 25 mg (25 mg Intravenous Given 04/27/23 1435)  prochlorperazine (COMPAZINE) injection 10 mg (10 mg Intravenous Given 04/27/23 1434)     IMPRESSION / MDM / ASSESSMENT AND PLAN / ED COURSE  I reviewed the triage vital signs and the nursing notes.  Differential diagnosis includes, but is not limited to, pneumonia, pneumothorax, musculoskeletal strain, lower suspicion ACS, low risk PE and PERC negative, low suspicion for acute intracranial process, headache not sudden in onset, no focal deficits, consideration for migraine, other benign headache  Patient's presentation is most consistent with acute presentation with potential threat to life or bodily function.  38 year old male presenting to the emergency department for evaluation of headache and chest pain.  Regarding chest pain, labs and EKG reassuring.  CXR  without acute findings.  Low suspicion for significant cardiac pathology.  Do think patient is stable for outpatient follow-up for for this.  Regarding his headache, no focal neurologic deficits, but patient has had ongoing headache with clinical history suggestive of migraine type headache.  Will trial symptomatic relief.  The patient is improved, suspect he will be stable for discharge home.  3:18 PM On reevaluation, patient feels much improved with complete resolution of his headache.  He is comfortable with discharge home.  Strict return precautions provided.  Patient discharged in stable condition.     FINAL CLINICAL IMPRESSION(S) / ED DIAGNOSES   Final diagnoses:  Nonspecific chest pain  Acute nonintractable headache, unspecified headache type     Rx / DC Orders   ED Discharge Orders     None        Note:  This document was prepared using Dragon voice recognition software and may include unintentional dictation errors.   Trinna Post, MD 04/27/23 [phone]

## 2023-04-27 NOTE — ED Triage Notes (Signed)
Pt comes with c/p and headache for week. Pt states left sided cp. Pt states it gets real tight and feels like someone is squeezing his chest. Pt states sometimes it feels like heartburn.   Pt denies any sob

## 2023-06-20 ENCOUNTER — Other Ambulatory Visit: Payer: Self-pay

## 2023-06-20 ENCOUNTER — Emergency Department
Admission: EM | Admit: 2023-06-20 | Discharge: 2023-06-20 | Disposition: A | Discharge status: Home / Self Care | Payer: Self-pay | Attending: Emergency Medicine | Admitting: Emergency Medicine

## 2023-06-20 ENCOUNTER — Ambulatory Visit: Payer: Self-pay

## 2023-06-20 DIAGNOSIS — M545 Low back pain, unspecified: Secondary | ICD-10-CM | POA: Insufficient documentation

## 2023-06-20 MED ORDER — HYDROCODONE-ACETAMINOPHEN 5-325 MG PO TABS: 1.0000 | ORAL_TABLET | Freq: Four times a day (QID) | ORAL | 0 refills | Status: DC | PRN

## 2023-06-20 MED ORDER — KETOROLAC TROMETHAMINE 30 MG/ML IJ SOLN
30.0000 mg | Freq: Once | INTRAMUSCULAR | Status: AC
  Administered 2023-06-20: 30 mg via INTRAMUSCULAR
  Filled 2023-06-20: qty 1

## 2023-06-20 MED ORDER — DEXAMETHASONE 4 MG PO TABS
8.0000 mg | ORAL_TABLET | Freq: Once | ORAL | Status: AC
  Administered 2023-06-20: 8 mg via ORAL
  Filled 2023-06-20: qty 2

## 2023-06-20 NOTE — Discharge Instructions (Signed)
 No driving or working in any sort of dangerous environment within 8 hours of use of hydrocodone.  Use only as prescribed.  You have been seen in the Emergency Department (ED)  today for back pain.  Your workup and exam have not shown any acute abnormalities and you are likely suffering from muscle strain or possible problems with your discs, but there is no treatment that will fix your symptoms at this time.  Please take Motrin (ibuprofen) as needed for your pain according to the instructions written on the box.  Alternatively, for the next five days you can take 600mg  three times daily with meals (it may upset your stomach).  Take hydrocodone as prescribed for severe pain. Do not drink alcohol, drive or participate in any other potentially dangerous activities while taking this medication as it may make you sleepy. Do not take this medication with any other sedating medications, either prescription or over-the-counter. If you were prescribed Percocet or Vicodin, do not take these with acetaminophen (Tylenol) as it is already contained within these medications.   This medication is an opiate (or narcotic) pain medication and can be habit forming.  Use it as little as possible to achieve adequate pain control.  Do not use or use it with extreme caution if you have a history of opiate abuse or dependence.  If you are on a pain contract with your primary care doctor or a pain specialist, be sure to let them know you were prescribed this medication today from the [facility] Emergency Department.  This medication is intended for your use only - do not give any to anyone else and keep it in a secure place where nobody else, especially children, have access to it.  It will also cause or worsen constipation, so you may want to consider taking an over-the-counter stool softener while you are taking this medication.  Please follow up with your doctor as soon as possible regarding today's ED visit and your back  pain.  Return to the ED for worsening back pain, fever, weakness or numbness of either leg, or if you develop either (1) an inability to urinate or have bowel movements, or (2) loss of your ability to control your bathroom functions (if you start having "accidents"), or if you develop other new symptoms that concern you.

## 2023-06-20 NOTE — ED Triage Notes (Signed)
 Pt here for lower back that started 1.5 weeks ago, no injury, pt states movement increases pain. Pt denies urinary s/s. NAD noted.

## 2023-06-20 NOTE — ED Notes (Signed)
 This RN reviewed paperwork with pt. No further complaints or questions. Pt ambulated to lobby.

## 2023-06-20 NOTE — ED Provider Notes (Signed)
 [facility] Provider Note    Event Date/Time   First MD Initiated Contact with Patient 06/20/23 [phone]     (approximate)   History   Back Pain   HPI  Dennis Hayes is a 38 y.o. male with remote asthma  Reports that he has had intermittent low back pain off and on for a while, but in particular last week and a half he has had quite a bit of low back pain along his mid lower back.  The pain does not shoot or radiate.  It seems worse in the morning his muscles feel tight.  No history of any cancer.  He does not have any fevers or chills.  He does not have any numbness or weakness.  He has no abdominal pain.  No problems emptying his bladder or problems with his bowels.  He reports the pain has persisted.  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       Physical Exam   Triage Vital Signs: ED Triage Vitals [06/20/23 0907]  Encounter Vitals Group     BP 129/84     Systolic BP Percentile      Diastolic BP Percentile      Pulse Rate 63     Resp 17     Temp 98.1 F (36.7 C)     Temp Source Oral     SpO2 99 %     Weight 228 lb (103.4 kg)     Height 5\' 10"  (1.778 m)     Head Circumference      Peak Flow      Pain Score 7     Pain Loc      Pain Education      Exclude from Growth Chart     Most recent vital signs: Vitals:   06/20/23 0907  BP: 129/84  Pulse: 63  Resp: 17  Temp: 98.1 F (36.7 C)  SpO2: 99%     General: Awake, no distress.  CV:  Good peripheral perfusion.  Strong palpable pulses in the lower extremities bilateral.  Dorsalis pedis and posterior tibial are normal.  Warm well-perfused lower extremity. Resp:  Normal effort.  Abd:  No distention.  Soft nontender nondistended throughout. Other:    Normal sensory examination across the pubis, upper thighs, lower back and feet bilaterally.  Demonstrates 5-5 strength in the feet knee and hip flexion/extension.   His patellar reflexes are normal bilateral.  He has focal tenderness and discomfort in the paraspinous regions of the mid lumbar spine bilaterally.  It does not radiate out.  There is no deformity or defect.  He denies any trauma or injury has occurred.   ED Results / Procedures / Treatments   Labs (all labs ordered are listed, but only abnormal results are displayed) Labs Reviewed - No data to display   EKG     RADIOLOGY  No acute indication for imaging.  No neurovascular deficit.  Clinical history suggest low risk for spinal cord or major lytic type lesion/infection.   PROCEDURES:  Critical Care performed: No  Procedures   MEDICATIONS ORDERED IN ED: Medications  ketorolac (TORADOL) 30 MG/ML injection 30 mg (30 mg Intramuscular Given 06/20/23 0932)  dexamethasone (DECADRON) tablet 8 mg (8 mg Oral Given 06/20/23 0932)     IMPRESSION / MDM / ASSESSMENT AND PLAN / ED COURSE  I reviewed the triage vital  signs and the nursing notes.                              Differential diagnosis includes, but is not limited to, lumbar ago, musculoskeletal causation, herniated nucleus pulposus, muscular strain or tear etc.  He does not have any symptoms of intra-abdominal pain no acute neurovascular symptoms.  He does not have a complex medical history he is afebrile without infectious symptoms.  Patient's presentation is most consistent with acute, uncomplicated illness.     Return precautions and treatment recommendations and follow-up discussed with the patient who is agreeable with the plan.  Carefully reviewed reviewed return precautions treatment plan. I will prescribe the patient a narcotic pain medicine due to their condition which I anticipate will cause at least moderate pain short term. I discussed with the patient safe use of narcotic pain medicines, and that they are not to drive, work in dangerous areas, or ever take more than prescribed (no more than 1-2 pill every 6  hours). We discussed that this is the type of medication that can be  overdosed on and the risks of this type of medicine. Patient is very agreeable to only use as prescribed and to never use more than prescribed.      FINAL CLINICAL IMPRESSION(S) / ED DIAGNOSES   Final diagnoses:  Acute midline low back pain without sciatica     Rx / DC Orders   ED Discharge Orders          Ordered    HYDROcodone-acetaminophen (NORCO/VICODIN) 5-325 MG tablet  Every 6 hours PRN        06/20/23 0939             Note:  This document was prepared using Dragon voice recognition software and may include unintentional dictation errors.   Sharyn Creamer, MD 06/20/23 [phone]

## 2023-07-09 ENCOUNTER — Encounter: Payer: Self-pay | Admitting: Intensive Care

## 2023-07-09 ENCOUNTER — Other Ambulatory Visit: Payer: Self-pay

## 2023-07-09 ENCOUNTER — Emergency Department
Admission: EM | Admit: 2023-07-09 | Discharge: 2023-07-09 | Disposition: A | Discharge status: Home / Self Care | Attending: Emergency Medicine | Admitting: Emergency Medicine

## 2023-07-09 DIAGNOSIS — S8011XA Contusion of right lower leg, initial encounter: Secondary | ICD-10-CM | POA: Diagnosis not present

## 2023-07-09 DIAGNOSIS — S3992XA Unspecified injury of lower back, initial encounter: Secondary | ICD-10-CM | POA: Diagnosis present

## 2023-07-09 DIAGNOSIS — S39012A Strain of muscle, fascia and tendon of lower back, initial encounter: Secondary | ICD-10-CM | POA: Diagnosis not present

## 2023-07-09 DIAGNOSIS — S8012XA Contusion of left lower leg, initial encounter: Secondary | ICD-10-CM | POA: Insufficient documentation

## 2023-07-09 DIAGNOSIS — Y92410 Unspecified street and highway as the place of occurrence of the external cause: Secondary | ICD-10-CM | POA: Diagnosis not present

## 2023-07-09 MED ORDER — NAPROXEN 500 MG PO TABS: 500.0000 mg | ORAL_TABLET | Freq: Two times a day (BID) | ORAL | 2 refills | Status: DC

## 2023-07-09 MED ORDER — METHOCARBAMOL 500 MG PO TABS: 500.0000 mg | ORAL_TABLET | Freq: Three times a day (TID) | ORAL | 1 refills | Status: DC | PRN
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
## 2023-10-02 ENCOUNTER — Other Ambulatory Visit: Payer: Self-pay

## 2023-10-02 ENCOUNTER — Encounter: Payer: Self-pay | Admitting: Emergency Medicine

## 2023-10-02 ENCOUNTER — Emergency Department
Admission: EM | Admit: 2023-10-02 | Discharge: 2023-10-02 | Disposition: A | Discharge status: Home / Self Care | Payer: MEDICAID | Attending: Emergency Medicine | Admitting: Emergency Medicine

## 2023-10-02 DIAGNOSIS — J45909 Unspecified asthma, uncomplicated: Secondary | ICD-10-CM | POA: Diagnosis not present

## 2023-10-02 DIAGNOSIS — R112 Nausea with vomiting, unspecified: Secondary | ICD-10-CM | POA: Diagnosis present

## 2023-10-02 LAB — URINALYSIS, ROUTINE W REFLEX MICROSCOPIC
Bilirubin Urine: NEGATIVE
Glucose, UA: NEGATIVE mg/dL
Hgb urine dipstick: NEGATIVE
Ketones, ur: NEGATIVE mg/dL
Leukocytes,Ua: NEGATIVE
Nitrite: NEGATIVE
Protein, ur: NEGATIVE mg/dL
Specific Gravity, Urine: 1.009 (ref 1.005–1.030)
pH: 7 (ref 5.0–8.0)

## 2023-10-02 LAB — CBC
HCT: 49.5 % (ref 39.0–52.0)
Hemoglobin: 16.3 g/dL (ref 13.0–17.0)
MCH: 28.3 pg (ref 26.0–34.0)
MCHC: 32.9 g/dL (ref 30.0–36.0)
MCV: 86.1 fL (ref 80.0–100.0)
Platelets: 341 10*3/uL (ref 150–400)
RBC: 5.75 MIL/uL (ref 4.22–5.81)
RDW: 12.5 % (ref 11.5–15.5)
WBC: 7.5 10*3/uL (ref 4.0–10.5)
nRBC: 0 % (ref 0.0–0.2)

## 2023-10-02 LAB — RESP PANEL BY RT-PCR (RSV, FLU A&B, COVID)  RVPGX2
Influenza A by PCR: NEGATIVE
Influenza B by PCR: NEGATIVE
Resp Syncytial Virus by PCR: NEGATIVE
SARS Coronavirus 2 by RT PCR: NEGATIVE

## 2023-10-02 LAB — COMPREHENSIVE METABOLIC PANEL WITH GFR
ALT: 18 U/L (ref 0–44)
AST: 18 U/L (ref 15–41)
Albumin: 4.1 g/dL (ref 3.5–5.0)
Alkaline Phosphatase: 61 U/L (ref 38–126)
Anion gap: 8 (ref 5–15)
BUN: 10 mg/dL (ref 6–20)
CO2: 28 mmol/L (ref 22–32)
Calcium: 9.5 mg/dL (ref 8.9–10.3)
Chloride: 102 mmol/L (ref 98–111)
Creatinine, Ser: 0.92 mg/dL (ref 0.61–1.24)
GFR, Estimated: >60 mL/min (ref >60)
Glucose, Bld: 118 mg/dL — ABNORMAL HIGH (ref 70–99)
Potassium: 4.5 mmol/L (ref 3.5–5.1)
Sodium: 138 mmol/L (ref 135–145)
Total Bilirubin: 0.9 mg/dL (ref 0.0–1.2)
Total Protein: 8.3 g/dL — ABNORMAL HIGH (ref 6.5–8.1)

## 2023-10-02 LAB — LIPASE, BLOOD: Lipase: 40 U/L (ref 11–51)

## 2023-10-02 MED ORDER — KETOROLAC TROMETHAMINE 15 MG/ML IJ SOLN
15.0000 mg | Freq: Once | INTRAMUSCULAR | Status: AC
  Administered 2023-10-02: 15 mg via INTRAVENOUS
  Filled 2023-10-02: qty 1

## 2023-10-02 MED ORDER — ONDANSETRON HCL 4 MG/2ML IJ SOLN
4.0000 mg | Freq: Once | INTRAMUSCULAR | Status: AC
  Administered 2023-10-02: 4 mg via INTRAVENOUS
  Filled 2023-10-02: qty 2

## 2023-10-02 MED ORDER — SODIUM CHLORIDE 0.9 % IV BOLUS
1000.0000 mL | Freq: Once | INTRAVENOUS | Status: AC
  Administered 2023-10-02: 1000 mL via INTRAVENOUS

## 2023-10-02 MED ORDER — ONDANSETRON 4 MG PO TBDP: 4.0000 mg | ORAL_TABLET | Freq: Three times a day (TID) | ORAL | 0 refills | Status: DC | PRN

## 2023-10-02 MED ORDER — METOCLOPRAMIDE HCL 5 MG/ML IJ SOLN
10.0000 mg | Freq: Once | INTRAMUSCULAR | Status: AC
  Administered 2023-10-02: 10 mg via INTRAVENOUS
  Filled 2023-10-02: qty 2

## 2023-10-02 NOTE — ED Triage Notes (Signed)
 Patient to ED via POV for vomiting. PT states it started last week. Also had headache x2 days. NAD noted.

## 2023-10-02 NOTE — Discharge Instructions (Signed)
 Your blood work was reassuring today.  I believe that your symptoms are caused by a viral infection and will resolve on its own with time.  Please return to the emergency department if you have any worsening symptoms, development of fever or abdominal pain.  I have sent some nausea medication to your pharmacy.  This can be taken every 8 hours as needed for nausea and vomiting.

## 2023-10-02 NOTE — ED Provider Notes (Signed)
 [facility] Provider Note    Event Date/Time   First MD Initiated Contact with Patient 10/02/23 [phone]     (approximate)   History   Emesis   HPI  Dennis Hayes is a 38 y.o. male with PMH of asthma, panic disorder and depression presents for evaluation of vomiting, headache, and general malaise. Patient states he has felt sick since last Wednesday, he has vomited a few times each day after eating, and his headache began two days ago. Patient states he has had some generalized abdominal pain from the vomiting. Denies fevers, diarrhea, constipation, urinary symptoms and back pain.       Physical Exam   Triage Vital Signs: ED Triage Vitals  Encounter Vitals Group     BP 10/02/23 0810 (!) 135/90     Girls Systolic BP Percentile --      Girls Diastolic BP Percentile --      Boys Systolic BP Percentile --      Boys Diastolic BP Percentile --      Pulse Rate 10/02/23 0810 65     Resp 10/02/23 0810 17     Temp 10/02/23 0810 98 F (36.7 C)     Temp Source 10/02/23 0810 Oral     SpO2 10/02/23 0810 99 %     Weight 10/02/23 0810 240 lb (108.9 kg)     Height 10/02/23 0810 5' 10 (1.778 m)     Head Circumference --      Peak Flow --      Pain Score 10/02/23 0809 0     Pain Loc --      Pain Education --      Exclude from Growth Chart --     Most recent vital signs: Vitals:   10/02/23 0810  BP: (!) 135/90  Pulse: 65  Resp: 17  Temp: 98 F (36.7 C)  SpO2: 99%   General: Awake, no distress.  CV:  Good peripheral perfusion. RRR. Resp:  Normal effort. CTAB. Abd:  No distention. Soft, mild TTP across the middle of the abdomen Other:     ED Results / Procedures / Treatments   Labs (all labs ordered are listed, but only abnormal results are displayed) Labs Reviewed  COMPREHENSIVE METABOLIC PANEL WITH GFR - Abnormal; Notable for the following components:      Result Value   Glucose, Bld 118 (*)    Total Protein 8.3 (*)    All other components  within normal limits  URINALYSIS, ROUTINE W REFLEX MICROSCOPIC - Abnormal; Notable for the following components:   Color, Urine STRAW (*)    APPearance CLEAR (*)    All other components within normal limits  RESP PANEL BY RT-PCR (RSV, FLU A&B, COVID)  RVPGX2  LIPASE, BLOOD  CBC    PROCEDURES:  Critical Care performed: No  Procedures   MEDICATIONS ORDERED IN ED: Medications  sodium chloride  0.9 % bolus 1,000 mL (1,000 mLs Intravenous New Bag/Given 10/02/23 0848)  ondansetron  (ZOFRAN ) injection 4 mg (4 mg Intravenous Given 10/02/23 0848)  ketorolac  (TORADOL ) 15 MG/ML injection 15 mg (15 mg Intravenous Given 10/02/23 0848)  metoCLOPramide  (REGLAN ) injection 10 mg (10 mg Intravenous Given 10/02/23 0940)     IMPRESSION / MDM / ASSESSMENT AND PLAN / ED COURSE  I reviewed the triage vital signs and the nursing notes.  38 year old male presents for evaluation of vomiting, headache and general malaise.  Blood pressure is elevated otherwise vital signs are stable.  Patient NAD on exam.  Differential diagnosis includes, but is not limited to, viral illness, flu, COVID, RSV, pancreatitis, biliary disease, appendicitis.  Patient's presentation is most consistent with acute complicated illness / injury requiring diagnostic workup.  Suspect that patient has a viral infection.  Will obtain labs, urine and respiratory panel.  If reassuring, patient would be stable for outpatient management.  He reported that he is still having some nausea and headache so we will treat with IV fluids, Zofran  and Toradol , then will reassess.  Clinical Course as of 10/02/23 1054  Tue Oct 02, 2023  0837 CBC Unremarkable, no leukocytosis. [LD]  0855 Lipase, blood Within normal limits, pancreatitis as a cause of vomiting is unlikely. [LD]  [id number] Comprehensive metabolic panel(!) Glucose and protein slightly elevated, otherwise normal. No electrolyte abnormalities or kidney injury. [LD]  [id number]  Urinalysis, Routine w reflex microscopic -Urine, Clean Catch(!) Unremarkable, no signs of infection. [LD]  0900 Did consider getting CT imaging as patient did have mild tenderness to palpation on exam, but lab work is reassuring. He was reassessed by my attending who repeated abdominal exam and he was not tender. Will hold off on imaging for now. [LD]  0951 Resp panel by RT-PCR (RSV, Flu A&B, Covid) Anterior Nasal Swab Negative. [LD]  9044 Patient reassessed, states he no longer feels nauseous and headache pain has resolved. Will PO challenge and if able to tolerate would be stable for discharge. [LD]  1052 Patient was able to tolerate PO. Feel he is stable for outpatient management.  Will send a prescription for Zofran  to his pharmacy and give him a note for work.  We discussed strict return precautions including worsening of symptoms, development of fever or abdominal pain.  Patient voiced understanding, all questions were answered and he was stable at discharge. [LD]    Clinical Course User Index [LD] Cleaster Tinnie LABOR, PA-C     FINAL CLINICAL IMPRESSION(S) / ED DIAGNOSES   Final diagnoses:  Nausea and vomiting, unspecified vomiting type     Rx / DC Orders   ED Discharge Orders          Ordered    ondansetron  (ZOFRAN -ODT) 4 MG disintegrating tablet  Every 8 hours PRN        10/02/23 1053             Note:  This document was prepared using Dragon voice recognition software and may include unintentional dictation errors.   Cleaster Tinnie LABOR, PA-C 10/02/23 1054    Levander Slate, MD 10/02/23 [phone]

## 2024-02-06 ENCOUNTER — Encounter: Payer: Self-pay | Admitting: Emergency Medicine

## 2024-02-06 ENCOUNTER — Emergency Department: Payer: MEDICAID

## 2024-02-06 ENCOUNTER — Other Ambulatory Visit: Payer: Self-pay

## 2024-02-06 ENCOUNTER — Emergency Department
Admission: EM | Admit: 2024-02-06 | Discharge: 2024-02-06 | Disposition: A | Discharge status: Home / Self Care | Payer: MEDICAID

## 2024-02-06 DIAGNOSIS — J020 Streptococcal pharyngitis: Secondary | ICD-10-CM | POA: Diagnosis not present

## 2024-02-06 DIAGNOSIS — R059 Cough, unspecified: Secondary | ICD-10-CM | POA: Diagnosis present

## 2024-02-06 DIAGNOSIS — J45909 Unspecified asthma, uncomplicated: Secondary | ICD-10-CM | POA: Insufficient documentation

## 2024-02-06 LAB — RESP PANEL BY RT-PCR (RSV, FLU A&B, COVID)  RVPGX2
Influenza A by PCR: NEGATIVE
Influenza B by PCR: NEGATIVE
Resp Syncytial Virus by PCR: NEGATIVE
SARS Coronavirus 2 by RT PCR: NEGATIVE

## 2024-02-06 LAB — GROUP A STREP BY PCR: Group A Strep by PCR: DETECTED — AB

## 2024-02-06 MED ORDER — LIDOCAINE VISCOUS HCL 2 % MT SOLN
15.0000 mL | Freq: Once | OROMUCOSAL | Status: AC
  Administered 2024-02-06: 15 mL via OROMUCOSAL
  Filled 2024-02-06: qty 15

## 2024-02-06 MED ORDER — ACETAMINOPHEN 500 MG PO TABS
1000.0000 mg | ORAL_TABLET | Freq: Three times a day (TID) | ORAL | 0 refills | Status: AC | PRN
Start: 2024-02-06 — End: 2024-02-13

## 2024-02-06 MED ORDER — DEXAMETHASONE SOD PHOSPHATE PF 10 MG/ML IJ SOLN
10.0000 mg | Freq: Once | INTRAMUSCULAR | Status: AC
  Administered 2024-02-06: 10 mg via INTRAMUSCULAR

## 2024-02-06 MED ORDER — LIDOCAINE VISCOUS HCL 2 % MT SOLN: 15.0000 mL | Freq: Four times a day (QID) | OROMUCOSAL | 0 refills | Status: AC | PRN

## 2024-02-06 MED ORDER — AMOXICILLIN 500 MG PO CAPS
500.0000 mg | ORAL_CAPSULE | Freq: Once | ORAL | Status: AC
  Administered 2024-02-06: 500 mg via ORAL
  Filled 2024-02-06: qty 1

## 2024-02-06 MED ORDER — AMOXICILLIN-POT CLAVULANATE 875-125 MG PO TABS: 1.0000 | ORAL_TABLET | Freq: Two times a day (BID) | ORAL | 0 refills | Status: AC

## 2024-02-06 NOTE — ED Triage Notes (Signed)
 Patient ambulatory to triage with steady gait, without difficulty or distress noted; pt reports x 3wks sore throat accomp by prod cough yellow sputum

## 2024-02-06 NOTE — Discharge Instructions (Addendum)
 You were seen in the emergency department for strep throat.  Please pick up and take the medications as prescribed.  Return to the emergency department if you start drooling, are unable to eat or drink anything, difficulty breathing, chest pain, or any other new, worsening or concerning symptoms.  I have given you referral to establish care with primary care.  Please look through the list of resources and give one of their offices a call at your earliest convenience and for recheck of your blood pressure.

## 2024-02-06 NOTE — ED Provider Notes (Signed)
 [facility] Provider Note    Event Date/Time   First MD Initiated Contact with Patient 02/06/24 2021     (approximate)   History   Sore Throat   HPI  Dennis Hayes is a 38 y.o. male  with a past medical history of panic disorder, moderate recurrent major depression, asthma presents to the emergency department with sore throat, productive cough with yellow sputum x 3 weeks.  Patient denies fever, chills, nausea, vomiting, chest pain, abdominal pain, dyspnea, difficulty swallowing.  Patient states he has been able to eat soup and popsicles at home as well as drink beverages.  He has been using NyQuil and TheraFlu for his symptoms.   Physical Exam   Triage Vital Signs: ED Triage Vitals  Encounter Vitals Group     BP 02/06/24 2000 (!) 140/90     Girls Systolic BP Percentile --      Girls Diastolic BP Percentile --      Boys Systolic BP Percentile --      Boys Diastolic BP Percentile --      Pulse Rate 02/06/24 2000 77     Resp 02/06/24 2000 16     Temp 02/06/24 2000 98.9 F (37.2 C)     Temp Source 02/06/24 2000 Oral     SpO2 02/06/24 2000 99 %     Weight 02/06/24 1958 245 lb (111.1 kg)     Height 02/06/24 1958 5' 10 (1.778 m)     Head Circumference --      Peak Flow --      Pain Score 02/06/24 1958 6     Pain Loc --      Pain Education --      Exclude from Growth Chart --     Most recent vital signs: Vitals:   02/06/24 2000  BP: (!) 140/90  Pulse: 77  Resp: 16  Temp: 98.9 F (37.2 C)  SpO2: 99%    General: Awake, in no acute distress. Appears stated age.  Patient's voice is hoarse. Eyes: No scleral icterus or conjunctival injection. Ears/Nose/Throat: TMs intact b/l. Nares patent, no nasal discharge. Oropharynx moist, with b/l erythema, no exudates. Dentition intact. No trismus, drooling, or uvular deviation. Neck: Supple, no lymphadenopathy, no nuchal rigidity. CV: Good peripheral perfusion. Regular rate, 77 bpm. Respiratory:Normal  respiratory effort.  No respiratory distress. CTAB. GI: Soft, non-distended. Skin:Warm, dry, intact. No rashes, lesions, or ecchymosis. No cyanosis or pallor.   ED Results / Procedures / Treatments   Labs (all labs ordered are listed, but only abnormal results are displayed) Labs Reviewed  GROUP A STREP BY PCR - Abnormal; Notable for the following components:      Result Value   Group A Strep by PCR DETECTED (*)    All other components within normal limits  RESP PANEL BY RT-PCR (RSV, FLU A&B, COVID)  RVPGX2     EKG     RADIOLOGY    PROCEDURES:  Critical Care performed: No   Procedures   MEDICATIONS ORDERED IN ED: Medications  amoxicillin  (AMOXIL ) capsule 500 mg (500 mg Oral Given 02/06/24 2113)  lidocaine  (XYLOCAINE ) 2 % viscous mouth solution 15 mL (15 mLs Mouth/Throat Given 02/06/24 2113)  dexamethasone  (DECADRON ) injection 10 mg (10 mg Intramuscular Given 02/06/24 2141)     IMPRESSION / MDM / ASSESSMENT AND PLAN / ED COURSE  I reviewed the triage vital signs and the nursing notes.  Differential diagnosis includes, but is not limited to, strep pharyngitis, viral pharyngitis, peritonsillar abscess, retropharyngeal abscess, COVID, flu, RSV  Patient's presentation is most consistent with acute complicated illness / injury requiring diagnostic workup.  Please see supervising physician's clinical course below.  Clinical Course as of 02/06/24 2310  Wed Feb 06, 2024  2133 Patient evaluated at request of treating APP.  Well-appearing 38 year old male with 2-3 weeks of sore throat, also with cough.  Vital signs stable, afebrile.  Has been treating with over-the-counter meds.  Tested positive for strep here, viral swab negative.  Chest x-ray with no evidence of underlying pneumonia.  Does have enlarged and erythematous tonsils, no exudate.  No uvular deviation.  No trismus, stridor, pooling of secretions.  No appreciable neck swelling.  No  clinical concern for underlying deep neck space infection at this time, no indication for advanced imaging at this time.  Recommend treatment with 10-day course of Augmentin , will give dose of Decadron  here as well.  Can use viscous lidocaine  for symptomatic management now and recommend Tylenol  and Motrin  moving forward.  Strict ED return precautions discussed in detail with patient.  Agrees with plan.  Plan for PMD follow-up. [MM]    Clinical Course User Index [MM] Clarine Ozell LABOR, MD   The patient may return to the emergency department for any new, worsening, or concerning symptoms. Patient was given the opportunity to ask questions; all questions were answered. Emergency department return precautions were discussed with the patient.  Patient is in agreement to the treatment plan.  Patient is stable for discharge.   FINAL CLINICAL IMPRESSION(S) / ED DIAGNOSES   Final diagnoses:  Strep throat     Rx / DC Orders   ED Discharge Orders          Ordered    amoxicillin -clavulanate (AUGMENTIN ) 875-125 MG tablet  2 times daily        02/06/24 2210    lidocaine  (XYLOCAINE ) 2 % solution  Every 6 hours PRN        02/06/24 2210    acetaminophen  (TYLENOL ) 500 MG tablet  Every 8 hours PRN        02/06/24 2210    Ambulatory Referral to Primary Care (Establish Care)        02/06/24 2214             Note:  This document was prepared using Dragon voice recognition software and may include unintentional dictation errors.     Sheron Salm, PA-C 02/06/24 2314    Clarine Ozell LABOR, MD 02/07/24 [phone]

## 2024-04-02 ENCOUNTER — Emergency Department
Admission: EM | Admit: 2024-04-02 | Discharge: 2024-04-02 | Disposition: A | Discharge status: Home / Self Care | Payer: MEDICAID

## 2024-04-02 ENCOUNTER — Other Ambulatory Visit: Payer: Self-pay

## 2024-04-02 DIAGNOSIS — J020 Streptococcal pharyngitis: Secondary | ICD-10-CM | POA: Insufficient documentation

## 2024-04-02 DIAGNOSIS — R059 Cough, unspecified: Secondary | ICD-10-CM | POA: Diagnosis present

## 2024-04-02 LAB — GROUP A STREP BY PCR: Group A Strep by PCR: DETECTED — AB

## 2024-04-02 MED ORDER — AMOXICILLIN-POT CLAVULANATE 875-125 MG PO TABS: 1.0000 | ORAL_TABLET | Freq: Two times a day (BID) | ORAL | 0 refills | Status: DC

## 2024-04-02 NOTE — ED Provider Notes (Signed)
 "  [facility] Provider Note    Event Date/Time   First MD Initiated Contact with Patient 04/02/24 [phone]     (approximate)   History   Cough and Sore Throat   HPI  Dennis Hayes is a 38 y.o. male with a past medical history of panic disorder and major depression who presents today for evaluation of cough and sore throat that began yesterday.  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  No chest pain or shortness of breath.  No abdominal pain, nausea, vomiting, diarrhea.  Reports that he had strep a couple of months ago.  No fevers or chills.  He has not taken any antipyretics today.   Patient Active Problem List   Diagnosis Date Noted   Panic disorder 08/05/2020   Moderate recurrent major depression (HCC) 08/05/2020          Physical Exam   Triage Vital Signs: ED Triage Vitals  Encounter Vitals Group     BP --      Girls Systolic BP Percentile --      Girls Diastolic BP Percentile --      Boys Systolic BP Percentile --      Boys Diastolic BP Percentile --      Pulse --      Resp --      Temp --      Temp src --      SpO2 --      Weight 04/02/24 0756 245 lb (111.1 kg)     Height 04/02/24 0756 5' 10 (1.778 m)     Head Circumference --      Peak Flow --      Pain Score 04/02/24 0755 4     Pain Loc --      Pain Education --      Exclude from Growth Chart --     Most recent vital signs: Vitals:   04/02/24 0758  BP: (!) 132/110  Pulse: 78  Resp: 18  Temp: 98.8 F (37.1 C)  SpO2: 98%    Physical Exam Vitals and nursing note reviewed.  Constitutional:      General: Awake and alert. No acute distress.    Appearance: Normal appearance.  HENT:     Head: Normocephalic and atraumatic.     Mouth: Mucous membranes are moist. Uvula midline.  No tonsillar exudate.  Posterior oropharyngeal erythema present.  No soft palate fluctuance.  No trismus.  No voice change.  No sublingual swelling.  No tender  cervical lymphadenopathy.  No nuchal rigidity Nasal congestion present Eyes:     General: PERRL. Normal EOMs        Right eye: No discharge.        Left eye: No discharge.     Conjunctiva/sclera: Conjunctivae normal.  Cardiovascular:     Rate and Rhythm: Normal rate and regular rhythm.     Pulses: Normal pulses.  Pulmonary:     Effort: Pulmonary effort is normal. No respiratory distress.  Able to speak easily in complete sentences, no tachypnea or increased work of breathing.    Breath sounds: Normal breath sounds.  Abdominal:     Abdomen is soft. There is no abdominal tenderness. No rebound or guarding. No distention. Musculoskeletal:        General: No swelling. Normal range of motion.     Cervical back: Normal range of motion and neck supple.  Skin:    General:  Skin is warm and dry.     Capillary Refill: Capillary refill takes less than 2 seconds.     Findings: No rash.  Neurological:     Mental Status: The patient is awake and alert.      ED Results / Procedures / Treatments   Labs (all labs ordered are listed, but only abnormal results are displayed) Labs Reviewed  GROUP A STREP BY PCR - Abnormal; Notable for the following components:      Result Value   Group A Strep by PCR DETECTED (*)    All other components within normal limits     EKG     RADIOLOGY     PROCEDURES:  Critical Care performed:   Procedures   MEDICATIONS ORDERED IN ED: Medications - No data to display   IMPRESSION / MDM / ASSESSMENT AND PLAN / ED COURSE  I reviewed the triage vital signs and the nursing notes.   Differential diagnosis includes, but is not limited to, strep pharyngitis, influenza, COVID-19.  Patient is awake and alert, hemodynamically stable and afebrile.  He has normal oxygen saturation 98% on room air.  He demonstrates no increased work of breathing and is able to speak easily in complete sentences.  Lungs are clear to auscultation bilaterally.  Uvula is  midline, no tonsillar exudate, no neck pain, no drooling, no trismus, not consistent with peritonsillar or retropharyngeal abscess.  We are out of influenza swabs in the hospital, therefore flu swab not obtained.  Strep swab was obtained and is positive for strep pharyngitis.  Patient was started on antibiotics for we discussed this diagnosis and the importance of completing the antibiotic therapy in order to prevent poststreptococcal complications.  We discussed return precautions as well as symptomatic management.  Patient understands and agrees with plan.  He was given a work note.  He was discharged in stable condition.    Patient's presentation is most consistent with acute complicated illness / injury requiring diagnostic workup.      FINAL CLINICAL IMPRESSION(S) / ED DIAGNOSES   Final diagnoses:  Strep pharyngitis     Rx / DC Orders   ED Discharge Orders          Ordered    amoxicillin -clavulanate (AUGMENTIN ) 875-125 MG tablet  2 times daily        04/02/24 0851             Note:  This document was prepared using Dragon voice recognition software and may include unintentional dictation errors.   Leveon Pelzer E, PA-C 04/02/24 1026    Nicholaus Rolland BRAVO, MD 04/02/24 1546  "

## 2024-04-02 NOTE — ED Triage Notes (Signed)
 Pt presents to ED from home C/O cough, sore throat, runny nose since yesterday. Denies CP, SOB, fever, chills. Denies known sick contacts.

## 2024-04-02 NOTE — Discharge Instructions (Signed)
Please take the antibiotics as prescribed.  Please follow-up with your outpatient provider.  Please return for any new, worsening, or changing symptoms or other concerns.  It was a pleasure caring for you today.

## 2024-04-03 ENCOUNTER — Other Ambulatory Visit: Payer: Self-pay

## 2024-04-03 ENCOUNTER — Emergency Department
Admission: EM | Admit: 2024-04-03 | Discharge: 2024-04-03 | Disposition: A | Discharge status: Home / Self Care | Payer: MEDICAID

## 2024-04-03 DIAGNOSIS — R112 Nausea with vomiting, unspecified: Secondary | ICD-10-CM | POA: Insufficient documentation

## 2024-04-03 MED ORDER — ONDANSETRON 4 MG PO TBDP
4.0000 mg | ORAL_TABLET | Freq: Once | ORAL | Status: AC
  Administered 2024-04-03: 4 mg via ORAL
  Filled 2024-04-03: qty 1

## 2024-04-03 MED ORDER — ONDANSETRON 4 MG PO TBDP: 4.0000 mg | ORAL_TABLET | Freq: Three times a day (TID) | ORAL | 0 refills | Status: AC | PRN

## 2024-04-03 NOTE — ED Provider Notes (Signed)
 "  [facility] Provider Note    Event Date/Time   First MD Initiated Contact with Patient 04/03/24 1151     (approximate)   History   Emesis   HPI  Dennis Hayes is a 39 y.o. male   presents to the ED with complaint of nausea and vomiting for the last 3 days.  Patient was seen in the ED yesterday and was diagnosed with having strep pharyngitis.  Patient was started on Augmentin  and states that he has had no problems taking the medication but has vomited twice today.      Physical Exam   Triage Vital Signs: ED Triage Vitals [04/03/24 1031]  Encounter Vitals Group     BP (!) 144/83     Girls Systolic BP Percentile      Girls Diastolic BP Percentile      Boys Systolic BP Percentile      Boys Diastolic BP Percentile      Pulse Rate 80     Resp 20     Temp 97.8 F (36.6 C)     Temp Source Oral     SpO2 98 %     Weight      Height      Head Circumference      Peak Flow      Pain Score 0     Pain Loc      Pain Education      Exclude from Growth Chart     Most recent vital signs: Vitals:   04/03/24 1031  BP: (!) 144/83  Pulse: 80  Resp: 20  Temp: 97.8 F (36.6 C)  SpO2: 98%     General: Awake, no distress.  Alert, talkative, nontoxic in appearance. CV:  Good peripheral perfusion.  Heart rate and rate and rhythm. Resp:  Normal effort.  Lungs clear bilaterally. Abd:  No distention.  Soft, nontender.  Bowel sounds normoactive x 4 quadrants. Other:  Patient is able to stand and ambulate without any assistance.   ED Results / Procedures / Treatments   Labs (all labs ordered are listed, but only abnormal results are displayed) Labs Reviewed - No data to display    PROCEDURES:  Critical Care performed:   Procedures   MEDICATIONS ORDERED IN ED: Medications  ondansetron  (ZOFRAN -ODT) disintegrating tablet 4 mg (4 mg Oral Given 04/03/24 1217)     IMPRESSION / MDM / ASSESSMENT AND PLAN / ED COURSE  I reviewed the triage vital  signs and the nursing notes.   Differential diagnosis includes, but is not limited to, vomiting, GI upset secondary to antibiotic use, viral illness.  39 year old male presents to the ED with complaint of vomiting x 2 that started this morning.  Patient was seen recently and diagnosed with strep pharyngitis.  He was placed on Augmentin  twice daily for 10 days.  Patient was given Zofran  while in the ED and was able to drink ginger ale without any continued vomiting.  Patient states that he is feeling much better.  His prescription for Zofran  was sent to the pharmacy.  Patient is encouraged to finish the entire 10-day course of Augmentin .  He is to follow-up with his PCP if any continued problems or return to the emergency department if any severe worsening of his symptoms.      Patient's presentation is most consistent with acute, uncomplicated illness.  FINAL CLINICAL IMPRESSION(S) / ED DIAGNOSES   Final diagnoses:  Nausea and vomiting, unspecified vomiting type  Rx / DC Orders   ED Discharge Orders          Ordered    ondansetron  (ZOFRAN -ODT) 4 MG disintegrating tablet  Every 8 hours PRN        04/03/24 1256             Note:  This document was prepared using Dragon voice recognition software and may include unintentional dictation errors.   Saunders Shona CROME, PA-C 04/03/24 1318    Rexford Reche HERO, MD 04/03/24 1532  "

## 2024-04-03 NOTE — ED Notes (Signed)
 See triage note  Presents with some n/v  States this started 3 days ago  Afebrile on arrival

## 2024-04-03 NOTE — Discharge Instructions (Signed)
 Follow-up with your primary care provider or urgent care if any continued problems or concerns.  Increase fluids to stay hydrated.  Avoid greasy or fried foods at this time due to your nausea and vomiting.  A prescription for Zofran  was sent to the pharmacy for you to use every 8 hours as needed for vomiting.

## 2024-04-03 NOTE — ED Triage Notes (Signed)
 Pt to ED via POV from home. Pt reports N/V for the last 3 days. Pt reports now is feeling light headed. Pt seen here yesterday for same. Still denies sick contacts.
